# Patient Record
Sex: Male | Born: 1970 | Race: White | Hispanic: No | Marital: Married | Smoking: Current some day smoker
Health system: Southern US, Community
[De-identification: ages and names within clinical notes are randomized; demographics above are authoritative.]

## PROBLEM LIST (undated history)

## (undated) DIAGNOSIS — E079 Disorder of thyroid, unspecified: Secondary | ICD-10-CM

## (undated) DIAGNOSIS — M1990 Unspecified osteoarthritis, unspecified site: Secondary | ICD-10-CM

## (undated) DIAGNOSIS — Z8052 Family history of malignant neoplasm of bladder: Secondary | ICD-10-CM

## (undated) DIAGNOSIS — N529 Male erectile dysfunction, unspecified: Secondary | ICD-10-CM

## (undated) DIAGNOSIS — C20 Malignant neoplasm of rectum: Secondary | ICD-10-CM

## (undated) DIAGNOSIS — Z800 Family history of malignant neoplasm of digestive organs: Secondary | ICD-10-CM

## (undated) DIAGNOSIS — E785 Hyperlipidemia, unspecified: Secondary | ICD-10-CM

## (undated) DIAGNOSIS — K219 Gastro-esophageal reflux disease without esophagitis: Secondary | ICD-10-CM

## (undated) DIAGNOSIS — C801 Malignant (primary) neoplasm, unspecified: Secondary | ICD-10-CM

## (undated) HISTORY — DX: Hyperlipidemia, unspecified: E78.5

## (undated) HISTORY — DX: Malignant neoplasm of rectum: C20

## (undated) HISTORY — DX: Family history of malignant neoplasm of bladder: Z80.52

## (undated) HISTORY — PX: PARATHYROIDECTOMY: SHX19

## (undated) HISTORY — PX: APPENDECTOMY: SHX54

## (undated) HISTORY — DX: Family history of malignant neoplasm of digestive organs: Z80.0

## (undated) HISTORY — PX: COLONOSCOPY: SHX174

## (undated) HISTORY — DX: Unspecified osteoarthritis, unspecified site: M19.90

## (undated) HISTORY — DX: Male erectile dysfunction, unspecified: N52.9

---

## 2010-11-09 DIAGNOSIS — N529 Male erectile dysfunction, unspecified: Secondary | ICD-10-CM | POA: Insufficient documentation

## 2010-11-09 DIAGNOSIS — E782 Mixed hyperlipidemia: Secondary | ICD-10-CM | POA: Insufficient documentation

## 2013-07-10 DIAGNOSIS — E7800 Pure hypercholesterolemia, unspecified: Secondary | ICD-10-CM | POA: Insufficient documentation

## 2013-07-10 DIAGNOSIS — E079 Disorder of thyroid, unspecified: Secondary | ICD-10-CM | POA: Insufficient documentation

## 2014-11-05 DIAGNOSIS — F17200 Nicotine dependence, unspecified, uncomplicated: Secondary | ICD-10-CM | POA: Insufficient documentation

## 2016-11-25 DIAGNOSIS — M170 Bilateral primary osteoarthritis of knee: Secondary | ICD-10-CM | POA: Insufficient documentation

## 2016-12-21 DIAGNOSIS — C801 Malignant (primary) neoplasm, unspecified: Secondary | ICD-10-CM

## 2016-12-21 HISTORY — DX: Malignant (primary) neoplasm, unspecified: C80.1

## 2016-12-24 DIAGNOSIS — Z85048 Personal history of other malignant neoplasm of rectum, rectosigmoid junction, and anus: Secondary | ICD-10-CM | POA: Insufficient documentation

## 2016-12-25 ENCOUNTER — Ambulatory Visit: Payer: BLUE CROSS/BLUE SHIELD | Admitting: Internal Medicine

## 2016-12-25 ENCOUNTER — Other Ambulatory Visit (INDEPENDENT_AMBULATORY_CARE_PROVIDER_SITE_OTHER): Payer: BLUE CROSS/BLUE SHIELD

## 2016-12-25 ENCOUNTER — Ambulatory Visit (INDEPENDENT_AMBULATORY_CARE_PROVIDER_SITE_OTHER)
Admission: RE | Admit: 2016-12-25 | Discharge: 2016-12-25 | Disposition: A | Discharge status: Home / Self Care | Payer: BLUE CROSS/BLUE SHIELD | Source: Ambulatory Visit | Attending: Internal Medicine | Admitting: Internal Medicine

## 2016-12-25 ENCOUNTER — Other Ambulatory Visit: Payer: Self-pay

## 2016-12-25 ENCOUNTER — Encounter: Payer: Self-pay | Admitting: *Deleted

## 2016-12-25 VITALS — BP 100/72 | HR 76 | Ht 75.0 in | Wt 232.2 lb

## 2016-12-25 DIAGNOSIS — C20 Malignant neoplasm of rectum: Secondary | ICD-10-CM

## 2016-12-25 LAB — COMPREHENSIVE METABOLIC PANEL
ALBUMIN: 4.7 g/dL (ref 3.5–5.2)
ALK PHOS: 54 U/L (ref 39–117)
ALT: 20 U/L (ref 0–53)
AST: 20 U/L (ref 0–37)
BUN: 13 mg/dL (ref 6–23)
CO2: 27 mEq/L (ref 19–32)
CREATININE: 1.12 mg/dL (ref 0.40–1.50)
Calcium: 9.4 mg/dL (ref 8.4–10.5)
Chloride: 103 mEq/L (ref 96–112)
GFR: 74.09 mL/min (ref >60.00)
GLUCOSE: 93 mg/dL (ref 70–99)
Potassium: 4.2 mEq/L (ref 3.5–5.1)
SODIUM: 136 meq/L (ref 135–145)
TOTAL PROTEIN: 7.7 g/dL (ref 6.0–8.3)
Total Bilirubin: 0.9 mg/dL (ref 0.2–1.2)

## 2016-12-25 LAB — CBC WITH DIFFERENTIAL/PLATELET
Basophils Absolute: 0 10*3/uL (ref 0.0–0.1)
Basophils Relative: 0.9 % (ref 0.0–3.0)
EOS ABS: 0.3 10*3/uL (ref 0.0–0.7)
Eosinophils Relative: 5.5 % — ABNORMAL HIGH (ref 0.0–5.0)
HCT: 45.9 % (ref 39.0–52.0)
HEMOGLOBIN: 15.3 g/dL (ref 13.0–17.0)
LYMPHS ABS: 2.3 10*3/uL (ref 0.7–4.0)
Lymphocytes Relative: 42.8 % (ref 12.0–46.0)
MCHC: 33.4 g/dL (ref 30.0–36.0)
MCV: 86.5 fl (ref 78.0–100.0)
MONO ABS: 0.6 10*3/uL (ref 0.1–1.0)
Monocytes Relative: 11.6 % (ref 3.0–12.0)
NEUTROS PCT: 39.2 % — AB (ref 43.0–77.0)
Neutro Abs: 2.1 10*3/uL (ref 1.4–7.7)
Platelets: 229 10*3/uL (ref 150.0–400.0)
RBC: 5.31 Mil/uL (ref 4.22–5.81)
RDW: 13.6 % (ref 11.5–15.5)
WBC: 5.3 10*3/uL (ref 4.0–10.5)

## 2016-12-25 LAB — PROTIME-INR
INR: 1 ratio (ref 0.8–1.0)
Prothrombin Time: 11 s (ref 9.6–13.1)

## 2016-12-25 MED ORDER — IOPAMIDOL (ISOVUE-300) INJECTION 61%
100.0000 mL | Freq: Once | INTRAVENOUS | Status: AC | PRN
  Administered 2016-12-25: 100 mL via INTRAVENOUS

## 2016-12-25 NOTE — Progress Notes (Signed)
HISTORY OF PRESENT ILLNESS:  Lucas Morrison is a 46 y.o. male , [place] refugee and current Therapist, sports at [employer], who presents today at the urging of a mutual friend and his own interest with a chief complaint newly diagnosed rectal cancer. Patient reports a 2-1/2 to three-month history of intermittent rectal bleeding. No other signs or symptoms. He subsequently underwent complete colonoscopy on 12/21/2016 with Dr. Mathews Robinsons in [place]. I have reviewed the colonoscopy and pathology reports. The examination was complete to the cecum with a the BBPS score of 8. The most significant finding was a polypoid ulcerative mid-rectal mass which was non-circumferential and nonobstructing. Biopsies revealed moderately differentiated adenocarcinoma. Incidental 4 mm polyp in the rectum also noted but not removed. No other abnormalities. Patient is accompanied today by his wife and son. He denies abdominal pain or weight loss. His mother was diagnosed with colon cancer in her 23s. Patient tells me that he was to have routine colonoscopy several years ago but ended up needing to cancel that appointment. No relevant blood work or x-rays for review. He is status post appendectomy approximately 6 months ago. Except for hyperlipidemia he is otherwise healthy. He does smoke.  REVIEW OF SYSTEMS:  All non-GI ROS negative unless otherwise stated in the history of present illness   Past Medical History:  Diagnosis Date  . Erectile dysfunction   . Hyperlipidemia   . Osteoarthritis   . Rectal adenocarcinoma [facility])     Past Surgical History:  Procedure Laterality Date  . APPENDECTOMY    . PARATHYROIDECTOMY Left     Social History Lucas Morrison  reports that he has been smoking.  he has never used smokeless tobacco. He reports that he drinks alcohol. He reports that he does not use drugs.  family history includes Colon cancer in his mother; Heart disease in his father; Lung cancer in his  father.  No Known Allergies     PHYSICAL EXAMINATION: Vital signs: BP 100/72   Pulse 76   Ht 6\' 3"  (1.905 m)   Wt 232 lb 3.2 oz (105.3 kg)   BMI 29.02 kg/m   Constitutional: generally well-appearing, no acute distress Psychiatric: alert and oriented x3, cooperative Eyes: extraocular movements intact, anicteric, conjunctiva pink Mouth: oral pharynx moist, no lesions Neck: supple no lymphadenopathy Cardiovascular: heart regular rate and rhythm, no murmur Lungs: clear to auscultation bilaterally Abdomen: soft, nontender, nondistended, no obvious ascites, no peritoneal signs, normal bowel sounds, no organomegaly Rectal: Omitted Extremities: no clubbing, cyanosis, or lower extremity edema bilaterally Skin: Tattoos on the anterior and posterior torso. no relevant lesions on visible extremities Neuro: No focal deficits. Cranial nerves intact  ASSESSMENT:  #1. Mid-rectal cancer. Moderately differentiated adenocarcinoma #2. Family history of colon cancer in mother in her 81s   PLAN:  #1. CBC, comprehensive metabolic panel, PT/INR, CEA level today #2. Contrast-enhanced CT scan of the chest, abdomen, and pelvis today. Rule out metastatic disease #3. Schedule endoscopic ultrasound with tattoo marking with Dr. Ardis Hughs. Discussed with Dr. Ardis Hughs #4. Patient has been referred to the GI oncology coordinator #5. Recall colonoscopy 1 year. Entered into the computer

## 2016-12-25 NOTE — H&P (View-Only) (Signed)
HISTORY OF PRESENT ILLNESS:  Lucas Morrison is a 46 y.o. male , [place] refugee and current Therapist, sports at [employer], who presents today at the urging of a mutual friend and his own interest with a chief complaint newly diagnosed rectal cancer. Patient reports a 2-1/2 to three-month history of intermittent rectal bleeding. No other signs or symptoms. He subsequently underwent complete colonoscopy on 12/21/2016 with Dr. Mathews Robinsons in [place]. I have reviewed the colonoscopy and pathology reports. The examination was complete to the cecum with a the BBPS score of 8. The most significant finding was a polypoid ulcerative mid-rectal mass which was non-circumferential and nonobstructing. Biopsies revealed moderately differentiated adenocarcinoma. Incidental 4 mm polyp in the rectum also noted but not removed. No other abnormalities. Patient is accompanied today by his wife and son. He denies abdominal pain or weight loss. His mother was diagnosed with colon cancer in her 3s. Patient tells me that he was to have routine colonoscopy several years ago but ended up needing to cancel that appointment. No relevant blood work or x-rays for review. He is status post appendectomy approximately 6 months ago. Except for hyperlipidemia he is otherwise healthy. He does smoke.  REVIEW OF SYSTEMS:  All non-GI ROS negative unless otherwise stated in the history of present illness   Past Medical History:  Diagnosis Date  . Erectile dysfunction   . Hyperlipidemia   . Osteoarthritis   . Rectal adenocarcinoma [facility])     Past Surgical History:  Procedure Laterality Date  . APPENDECTOMY    . PARATHYROIDECTOMY Left     Social History Lucas Morrison  reports that he has been smoking.  he has never used smokeless tobacco. He reports that he drinks alcohol. He reports that he does not use drugs.  family history includes Colon cancer in his mother; Heart disease in his father; Lung cancer in his  father.  No Known Allergies     PHYSICAL EXAMINATION: Vital signs: BP 100/72   Pulse 76   Ht 6\' 3"  (1.905 m)   Wt 232 lb 3.2 oz (105.3 kg)   BMI 29.02 kg/m   Constitutional: generally well-appearing, no acute distress Psychiatric: alert and oriented x3, cooperative Eyes: extraocular movements intact, anicteric, conjunctiva pink Mouth: oral pharynx moist, no lesions Neck: supple no lymphadenopathy Cardiovascular: heart regular rate and rhythm, no murmur Lungs: clear to auscultation bilaterally Abdomen: soft, nontender, nondistended, no obvious ascites, no peritoneal signs, normal bowel sounds, no organomegaly Rectal: Omitted Extremities: no clubbing, cyanosis, or lower extremity edema bilaterally Skin: Tattoos on the anterior and posterior torso. no relevant lesions on visible extremities Neuro: No focal deficits. Cranial nerves intact  ASSESSMENT:  #1. Mid-rectal cancer. Moderately differentiated adenocarcinoma #2. Family history of colon cancer in mother in her 69s   PLAN:  #1. CBC, comprehensive metabolic panel, PT/INR, CEA level today #2. Contrast-enhanced CT scan of the chest, abdomen, and pelvis today. Rule out metastatic disease #3. Schedule endoscopic ultrasound with tattoo marking with Dr. Ardis Hughs. Discussed with Dr. Ardis Hughs #4. Patient has been referred to the GI oncology coordinator #5. Recall colonoscopy 1 year. Entered into the computer

## 2016-12-25 NOTE — Patient Instructions (Addendum)
Your physician has requested that you go to the basement for the following lab work before leaving today: CBC, CMET, PT/INR, CEA  You have been scheduled for a CT scan of the chest/abdomen and pelvis at [facility] ([address]---this is in the same building as Press [facility]).   You are scheduled on TODAY at 2:00 pm. You should arrive 15 minutes prior to your appointment time for registration. Please follow the written instructions below on the day of your exam:  WARNING: IF YOU ARE ALLERGIC TO IODINE/X-RAY DYE, PLEASE NOTIFY RADIOLOGY IMMEDIATELY AT [phone]! YOU WILL BE GIVEN A 13 HOUR PREMEDICATION PREP.  1) Do not eat or drink anything after 10:00 am (4 hours prior to your test) 2) You have been given 2 bottles of oral contrast to drink. The solution may taste               better if refrigerated, but do NOT add ice or any other liquid to this solution. Shake             well before drinking.    Drink 1 bottle of contrast @ 12:00 pm (2 hours prior to your exam)  Drink 1 bottle of contrast @ 1:00 pm (1 hour prior to your exam)  You may take any medications as prescribed with a small amount of water except for the following: Metformin, Glucophage, Glucovance, Avandamet, Riomet, Fortamet, Actoplus Met, Janumet, Glumetza or Metaglip. The above medications must be held the day of the exam AND 48 hours after the exam.  The purpose of you drinking the oral contrast is to aid in the visualization of your intestinal tract. The contrast solution may cause some diarrhea. Before your exam is started, you will be given a small amount of fluid to drink. Depending on your individual set of symptoms, you may also receive an intravenous injection of x-ray contrast/dye. Plan on being at [facility] for 30 minutes or longer, depending on the type of exam you are having performed.  This test typically takes 30-45 minutes to complete.  If you have any questions regarding your  exam or if you need to reschedule, you may call the CT department at [phone] between the hours of 8:00 am and 5:00 pm, Monday-Friday.  ________________________________________________________________________  We will contact you regarding scheduling EUS (endoscopic ultrasound) with Dr Ardis Hughs after CT scan.  You will be due for a recall colonoscopy in 12/2017. We will send you a reminder in the mail when it gets closer to that time.  If you are age 37 or older, your body mass index should be between 23-30. Your Body mass index is 29.02 kg/m. If this is out of the aforementioned range listed, please consider follow up with your Primary Care Provider.  If you are age 14 or younger, your body mass index should be between 19-25. Your Body mass index is 29.02 kg/m. If this is out of the aformentioned range listed, please consider follow up with your Primary Care Provider.   Thank you for entrusting me with your care, Dr Scarlette Shorts

## 2016-12-28 ENCOUNTER — Telehealth: Payer: Self-pay | Admitting: Oncology

## 2016-12-28 ENCOUNTER — Encounter: Payer: Self-pay | Admitting: Radiation Oncology

## 2016-12-28 DIAGNOSIS — C20 Malignant neoplasm of rectum: Secondary | ICD-10-CM

## 2016-12-28 LAB — CEA: CEA: 3.5 ng/mL — AB

## 2016-12-28 NOTE — Progress Notes (Signed)
GI Location of Tumor / Histology: Rectal Cancer  Lucas Morrison presented  months ago with symptoms of: 2-3 months rectal bleeding  Biopsies of  (if applicable) revealed: 92/42/68 =Moderately diofferentiated adenocarcinoma   Past/Anticipated interventions by surgeon, if any: Colonoscopy with biopsy 12/21/16, Dr. Mathews Robinsons in [facility],   Past/Anticipated interventions by medical oncology, if any: Dr. Benay Spice appt 01/12/2017  Weight changes, if any: No  Bowel/Bladder complaints, if any no  Nausea / Vomiting, if any: No  Pain issues, if any: NO  Any blood per rectum:   2 days ago spotting slight blood  SAFETY ISSUES: NO  Prior radiation? No  Pacemaker/ICD? NO  Is the patient on methotrexate? NO  Current Complaints/Details: Married, [place] refugee , s/p appendectomy  6 months ago,parathyroidectomy ,left,, smokes cigarettes, no smokeless tobacco, drinks alcohol,no drug use Mother dx colon cancer in her 31's,Father heart disease,lung cancer  Allergies:NKA BP 120/79   Pulse 67   Temp 97.6 F (36.4 C) (Oral)   Resp 20   Ht 6\' 3"  (1.905 m)   Wt 227 lb 9.6 oz (103.2 kg)   BMI 28.45 kg/m

## 2016-12-28 NOTE — Telephone Encounter (Signed)
Left message on voicemail regarding appointment date/time/location/phone #

## 2016-12-30 ENCOUNTER — Encounter: Payer: Self-pay | Admitting: Radiation Oncology

## 2016-12-30 ENCOUNTER — Other Ambulatory Visit: Payer: Self-pay

## 2016-12-30 DIAGNOSIS — C20 Malignant neoplasm of rectum: Secondary | ICD-10-CM

## 2016-12-30 NOTE — Progress Notes (Signed)
Radiation Oncology         [phone] ________________________________  Name: Lucas Morrison        MRN: [id number]  Date of Service: 12/31/2016 DOB: 1970/11/17  NW:GNFAOZ, Lucas Murdoch, PA-C  Ladell Pier, MD     REFERRING PHYSICIAN: Ladell Pier, MD   DIAGNOSIS: The encounter diagnosis was Rectal cancer [facility]).   HISTORY OF PRESENT ILLNESS: Lucas Morrison is a 46 y.o. male seen at the request of Dr. Henrene Pastor for a new diagnosis of adenocarcinoma of the rectum.  The patient developed rectal bleeding 2-3 months ago, and was seen for evaluation in [place].  He underwent colonoscopy on 12/21/2016.  On exam he didn't have any palpable findings. He had a polypoid ulcerative mid rectal mass which was not circumferential and nonobstructing, and a biopsy revealed moderately differentiated adenocarcinoma he also had a 4 mm incidental in the rectum which was noted but not removed.  He met with Dr. Henrene Pastor on 12/25/2016, and CT scan of the chest abdomen and pelvis revealed no evidence of metastatic disease or regional adenopathy.  He has been scheduled to undergo endoscopic ultrasound this afternoon with Dr. Ardis Hughs, and comes today to discuss the role of radiotherapy.  He is scheduled to see Dr. Benay Spice on 01/13/17.    PREVIOUS RADIATION THERAPY: No   PAST MEDICAL HISTORY:  Past Medical History:  Diagnosis Date  . Cancer ([place]) 12/21/2016   Rectal  . Erectile dysfunction   . Hyperlipidemia   . Osteoarthritis   . Rectal adenocarcinoma ([place])   . Thyroid disease    left parathyroidectomy       PAST SURGICAL HISTORY: Past Surgical History:  Procedure Laterality Date  . APPENDECTOMY    . PARATHYROIDECTOMY Left      FAMILY HISTORY:  Family History  Problem Relation Age of Onset  . Colon cancer Mother        39's at dx  . Lung cancer Father   . Heart disease Father      SOCIAL HISTORY:  reports that he has been smoking.  he has never used smokeless tobacco. He reports that he  drinks alcohol. He reports that he does not use drugs. The patient is married and lives in [place]. He is originally from [place], and has lived in the [place] since the 1990s. He works as a Associate Professor for an Journalist, newspaper. His is accompanied by his wife and has two sons.   ALLERGIES: Patient has no known allergies.   MEDICATIONS:  Current Outpatient Medications  Medication Sig Dispense Refill  . ezetimibe (ZETIA) 10 MG tablet Take 10 mg by mouth daily.    . fenofibrate 160 MG tablet Take 160 mg by mouth daily with lunch.     . Multiple Vitamin (MULTIVITAMIN WITH MINERALS) TABS tablet Take 1 tablet by mouth daily.    . naproxen sodium (ALEVE) 220 MG tablet Take 220-440 mg by mouth daily as needed (pain).     . Omega-3 Fatty Acids (FISH OIL) 1000 MG CAPS Take 1,000 mg by mouth daily.    . rosuvastatin (CRESTOR) 40 MG tablet Take 40 mg by mouth daily with supper.     . sildenafil (VIAGRA) 100 MG tablet Take 100 mg by mouth as needed for erectile dysfunction.    Marland Kitchen guaiFENesin (MUCINEX) 600 MG 12 hr tablet Take 600 mg by mouth daily as needed for cough.     No current facility-administered medications for this encounter.      REVIEW  OF SYSTEMS: On review of systems, the patient reports that he is doing well overall. He denies any chest pain, shortness of breath, cough, fevers, chills, night sweats, unintended weight changes. He continues to have bright red blood per rectum with bowel movements. He denies any bladder disturbances, and denies abdominal pain, nausea or vomiting. He denies any new musculoskeletal or joint aches or pains. A complete review of systems is obtained and is otherwise negative.    PHYSICAL EXAM:  Wt Readings from Last 3 Encounters:  12/31/16 227 lb 9.6 oz (103.2 kg)  12/31/16 227 lb 9.6 oz (103.2 kg)  12/25/16 232 lb 3.2 oz (105.3 kg)   Temp Readings from Last 3 Encounters:  12/31/16 97.6 F (36.4 C) (Oral)  12/31/16 97.6 F  (36.4 C) (Oral)   BP Readings from Last 3 Encounters:  12/31/16 120/79  12/31/16 120/79  12/25/16 100/72   Pulse Readings from Last 3 Encounters:  12/31/16 67  12/31/16 67  12/25/16 76   Pain Assessment Pain Score: 0-No pain/10   In general this is a well appearing [place] European male in no acute distress. He is alert and oriented x4 and appropriate throughout the examination. HEENT reveals that the patient is normocephalic, atraumatic. EOMs are intact. PERRLA. Skin is intact without any evidence of gross lesions. Cardiovascular exam reveals a regular rate and rhythm, no clicks rubs or murmurs are auscultated. Chest is clear to auscultation bilaterally. Lymphatic assessment is performed and does not reveal any adenopathy in the cervical, supraclavicular, axillary, or inguinal chains. Abdomen has active bowel sounds in all quadrants and is intact. The abdomen is soft, non tender, non distended. Lower extremities are negative for pretibial pitting edema, deep calf tenderness, cyanosis or clubbing. Rectal exam is deferred.   ECOG = 1  0 - Asymptomatic (Fully active, able to carry on all predisease activities without restriction)  1 - Symptomatic but completely ambulatory (Restricted in physically strenuous activity but ambulatory and able to carry out work of a light or sedentary nature. For example, light housework, office work)  2 - Symptomatic, <50% in bed during the day (Ambulatory and capable of all self care but unable to carry out any work activities. Up and about more than 50% of waking hours)  3 - Symptomatic, >50% in bed, but not bedbound (Capable of only limited self-care, confined to bed or chair 50% or more of waking hours)  4 - Bedbound (Completely disabled. Cannot carry on any self-care. Totally confined to bed or chair)  5 - Death   Eustace Pen MM, Creech RH, Tormey DC, et al. [phone]). "Toxicity and response criteria of the [facility]". [place]  Oncol. 5 (6): 649-55    LABORATORY DATA:  Lab Results  Component Value Date   WBC 5.3 12/25/2016   HGB 15.3 12/25/2016   HCT 45.9 12/25/2016   MCV 86.5 12/25/2016   PLT 229.0 12/25/2016   Lab Results  Component Value Date   NA 136 12/25/2016   K 4.2 12/25/2016   CL 103 12/25/2016   CO2 27 12/25/2016   Lab Results  Component Value Date   ALT 20 12/25/2016   AST 20 12/25/2016   ALKPHOS 54 12/25/2016   BILITOT 0.9 12/25/2016      RADIOGRAPHY: Ct Chest W Contrast  Result Date: 12/26/2016 CLINICAL DATA:  New diagnosis rectal cancer. Evaluate for metastatic disease. EXAM: CT CHEST, ABDOMEN, AND PELVIS WITH CONTRAST TECHNIQUE: Multidetector CT imaging of the chest, abdomen and pelvis was  performed following the standard protocol during bolus administration of intravenous contrast. CONTRAST:  124m ISOVUE-300 IOPAMIDOL (ISOVUE-300) INJECTION 61% COMPARISON:  None. FINDINGS: CT CHEST FINDINGS Cardiovascular: Heart is normal size. Aorta is normal caliber. Mediastinum/Nodes: No mediastinal, hilar, or axillary adenopathy. Trachea and esophagus are unremarkable. Thyroid unremarkable. Lungs/Pleura: Lungs are clear. No focal airspace opacities or suspicious nodules. No effusions. Musculoskeletal: No acute bony abnormality or focal bone lesion. CT ABDOMEN PELVIS FINDINGS Hepatobiliary: No focal hepatic abnormality. Gallbladder unremarkable. Pancreas: No focal abnormality or ductal dilatation. Spleen: No focal abnormality.  Normal size. Adrenals/Urinary Tract: No adrenal abnormality. No focal renal abnormality. No stones or hydronephrosis. Urinary bladder is unremarkable. Stomach/Bowel: Patient's known rectal cancer is not evident by CT. Stomach, large and small bowel grossly unremarkable. Vascular/Lymphatic: Aortic and iliac calcifications. No evidence of aneurysm or adenopathy. No visible perirectal lymph nodes. Reproductive: No visible focal abnormality. Other: No free fluid or free air.  Musculoskeletal: Degenerative changes in the lumbar spine. IMPRESSION: No evidence of metastatic disease in the chest, abdomen or pelvis. Electronically Signed   By: KRolm BaptiseM.D.   On: 12/26/2016 14:58   Ct Abdomen Pelvis W Contrast  Result Date: 12/26/2016 CLINICAL DATA:  New diagnosis rectal cancer. Evaluate for metastatic disease. EXAM: CT CHEST, ABDOMEN, AND PELVIS WITH CONTRAST TECHNIQUE: Multidetector CT imaging of the chest, abdomen and pelvis was performed following the standard protocol during bolus administration of intravenous contrast. CONTRAST:  1034mISOVUE-300 IOPAMIDOL (ISOVUE-300) INJECTION 61% COMPARISON:  None. FINDINGS: CT CHEST FINDINGS Cardiovascular: Heart is normal size. Aorta is normal caliber. Mediastinum/Nodes: No mediastinal, hilar, or axillary adenopathy. Trachea and esophagus are unremarkable. Thyroid unremarkable. Lungs/Pleura: Lungs are clear. No focal airspace opacities or suspicious nodules. No effusions. Musculoskeletal: No acute bony abnormality or focal bone lesion. CT ABDOMEN PELVIS FINDINGS Hepatobiliary: No focal hepatic abnormality. Gallbladder unremarkable. Pancreas: No focal abnormality or ductal dilatation. Spleen: No focal abnormality.  Normal size. Adrenals/Urinary Tract: No adrenal abnormality. No focal renal abnormality. No stones or hydronephrosis. Urinary bladder is unremarkable. Stomach/Bowel: Patient's known rectal cancer is not evident by CT. Stomach, large and small bowel grossly unremarkable. Vascular/Lymphatic: Aortic and iliac calcifications. No evidence of aneurysm or adenopathy. No visible perirectal lymph nodes. Reproductive: No visible focal abnormality. Other: No free fluid or free air. Musculoskeletal: Degenerative changes in the lumbar spine. IMPRESSION: No evidence of metastatic disease in the chest, abdomen or pelvis. Electronically Signed   By: KeRolm Baptise.D.   On: 12/26/2016 14:58       IMPRESSION/PLAN: 1. Adenocarcinoma of the  rectum. Dr. MoLisbeth Renshawiscusses the pathology findings and reviews the nature of rectal cancer. We will await his formal EUS result today, but based on his colonoscopy description, would anticipate a course of neoadjuvant radiotherapy with chemosensitization. He is scheduled for evaluation with Dr. ThMarcello Mooresn CRGrand Riverext Thursday, and to see Dr. ShBenay Spicen 01/13/17. Dr. MoLisbeth Renshawutlines the goals of radiation and recommends proceeding. We discussed the risks, benefits, short, and long term effects of radiotherapy, and the patient is interested in proceeding. Dr. MoLisbeth Renshawiscusses the delivery and logistics of radiotherapy and anticipates a course of 5 1/2-6 weeks. Written consent is obtained and placed in the chart, a copy was provided to the patient. He will simulate this morning with anticipation of starting treatment on 01/18/17. 2. Possible genetic predisposition to malignancy. The patient is counseled on his family history and personal history and that there may be a genetic link to [facility]. We discussed referral to genetic counseling  and he is in agreement with this plan.  The above documentation reflects my direct findings during this shared patient visit. Please see the separate note by Dr. Lisbeth Renshaw on this date for the remainder of the patient's plan of care.    Carola Rhine, PAC

## 2016-12-31 ENCOUNTER — Ambulatory Visit (HOSPITAL_COMMUNITY)
Admission: RE | Admit: 2016-12-31 | Discharge: 2016-12-31 | Disposition: A | Discharge status: Home / Self Care | Payer: BLUE CROSS/BLUE SHIELD | Source: Ambulatory Visit | Attending: Gastroenterology | Admitting: Gastroenterology

## 2016-12-31 ENCOUNTER — Other Ambulatory Visit: Payer: Self-pay

## 2016-12-31 ENCOUNTER — Encounter: Payer: Self-pay | Admitting: Radiation Oncology

## 2016-12-31 ENCOUNTER — Encounter (HOSPITAL_COMMUNITY): Payer: Self-pay | Admitting: Gastroenterology

## 2016-12-31 ENCOUNTER — Encounter (HOSPITAL_COMMUNITY)
Admission: RE | Disposition: A | Discharge status: Home / Self Care | Payer: Self-pay | Source: Ambulatory Visit | Attending: Gastroenterology

## 2016-12-31 ENCOUNTER — Ambulatory Visit
Admission: RE | Admit: 2016-12-31 | Discharge: 2016-12-31 | Disposition: A | Discharge status: Home / Self Care | Payer: BLUE CROSS/BLUE SHIELD | Source: Ambulatory Visit | Attending: Radiation Oncology | Admitting: Radiation Oncology

## 2016-12-31 VITALS — BP 120/79 | HR 67 | Temp 97.6°F | Resp 20 | Ht 75.0 in | Wt 227.6 lb

## 2016-12-31 DIAGNOSIS — M1990 Unspecified osteoarthritis, unspecified site: Secondary | ICD-10-CM | POA: Insufficient documentation

## 2016-12-31 DIAGNOSIS — Z510 Encounter for antineoplastic radiation therapy: Secondary | ICD-10-CM | POA: Insufficient documentation

## 2016-12-31 DIAGNOSIS — K6289 Other specified diseases of anus and rectum: Secondary | ICD-10-CM

## 2016-12-31 DIAGNOSIS — F17200 Nicotine dependence, unspecified, uncomplicated: Secondary | ICD-10-CM | POA: Diagnosis not present

## 2016-12-31 DIAGNOSIS — C20 Malignant neoplasm of rectum: Secondary | ICD-10-CM | POA: Diagnosis present

## 2016-12-31 DIAGNOSIS — E785 Hyperlipidemia, unspecified: Secondary | ICD-10-CM | POA: Insufficient documentation

## 2016-12-31 DIAGNOSIS — C218 Malignant neoplasm of overlapping sites of rectum, anus and anal canal: Secondary | ICD-10-CM | POA: Diagnosis not present

## 2016-12-31 DIAGNOSIS — K621 Rectal polyp: Secondary | ICD-10-CM | POA: Diagnosis not present

## 2016-12-31 DIAGNOSIS — E079 Disorder of thyroid, unspecified: Secondary | ICD-10-CM | POA: Insufficient documentation

## 2016-12-31 DIAGNOSIS — Z79899 Other long term (current) drug therapy: Secondary | ICD-10-CM | POA: Diagnosis not present

## 2016-12-31 DIAGNOSIS — Z800 Family history of malignant neoplasm of digestive organs: Secondary | ICD-10-CM | POA: Diagnosis not present

## 2016-12-31 HISTORY — PX: EUS: SHX5427

## 2016-12-31 HISTORY — DX: Malignant (primary) neoplasm, unspecified: C80.1

## 2016-12-31 HISTORY — DX: Disorder of thyroid, unspecified: E07.9

## 2016-12-31 SURGERY — ULTRASOUND, LOWER GI TRACT, ENDOSCOPIC
Anesthesia: Moderate Sedation

## 2016-12-31 MED ORDER — MIDAZOLAM HCL 5 MG/ML IJ SOLN
INTRAMUSCULAR | Status: AC
  Filled 2016-12-31: qty 2

## 2016-12-31 MED ORDER — DIPHENHYDRAMINE HCL 50 MG/ML IJ SOLN
INTRAMUSCULAR | Status: AC
  Filled 2016-12-31: qty 1

## 2016-12-31 MED ORDER — MIDAZOLAM HCL 10 MG/2ML IJ SOLN
INTRAMUSCULAR | Status: DC | PRN
  Administered 2016-12-31 (×3): 2 mg via INTRAVENOUS

## 2016-12-31 MED ORDER — SPOT INK MARKER SYRINGE KIT
PACK | SUBMUCOSAL | Status: AC
  Filled 2016-12-31: qty 5

## 2016-12-31 MED ORDER — SODIUM CHLORIDE 0.9 % IV SOLN
INTRAVENOUS | Status: DC
  Administered 2016-12-31: 14:00:00 via INTRAVENOUS

## 2016-12-31 MED ORDER — FENTANYL CITRATE (PF) 100 MCG/2ML IJ SOLN
INTRAMUSCULAR | Status: AC
  Filled 2016-12-31: qty 2

## 2016-12-31 MED ORDER — SPOT INK MARKER SYRINGE KIT
PACK | SUBMUCOSAL | Status: DC | PRN
  Administered 2016-12-31: 4 mL via SUBMUCOSAL

## 2016-12-31 MED ORDER — DIPHENHYDRAMINE HCL 50 MG/ML IJ SOLN
INTRAMUSCULAR | Status: DC | PRN
  Administered 2016-12-31 (×3): 25 mg via INTRAVENOUS

## 2016-12-31 NOTE — Progress Notes (Signed)
Please see the Nurse Progress Note in the MD Initial Consult Encounter for this patient. 

## 2016-12-31 NOTE — Op Note (Signed)
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 Estimated blood loss:                            None. Estimated Blood Loss:     Estimated blood loss: none. Procedure:                Pre-Anesthesia Assessment:                           - Prior to the procedure, a History and Physical                            was performed, and patient medications and                            allergies were reviewed. The patient's tolerance of                            previous anesthesia was also reviewed. The risks                            and benefits of the procedure and the sedation                            options and risks were discussed with the patient.                            All questions were answered, and informed consent                            was obtained. Prior Anticoagulants: The patient has  taken no previous anticoagulant or antiplatelet                            agents. ASA Grade Assessment: II - A patient with                            mild systemic disease. After reviewing the risks                            and benefits, the patient was deemed in                            satisfactory condition to undergo the procedure.                           After obtaining  informed consent, the endoscope was                            passed under direct vision. Throughout the                            procedure, the patient's blood pressure, pulse, and                            oxygen saturations were monitored continuously. The                            [id number] ([id number]) scope was introduced through                            the anus and advanced to the the sigmoid colon. The                            lower EUS was accomplished without difficulty. The                            patient tolerated the procedure well. The quality                            of the bowel preparation was good. Scope In: Scope Out: Findings:      Sigmoidoscopic findings:      1. Round, non-circumferential clearly malignant mass along the wall of       the posterior rectum. The mass is about 3cm across and the distal edge       lays 6cm from the anal verge.      2. Following EUS evalution, I labeled the mass with 3 submucosal       injections of [place] Ink directly adjacent to the mass.      Endosonographic Finding :      1. The mass above correlates with a 3.5cm hypoechoic lesion that is 87mm       thick, passes focally into the muscularis propria layer but not through       it (uT2).      2. There is no perirectal adenopathy (uN0).  Impression:               - 3.5cm, round, non-circumferential uT2N0                            adenocarcinoma which lays on the posterior wall of                            the rectum with distal edge 6cm from the anal verge.                           - The mass was labeled with submucosal injections                            of [place] Ink. Moderate Sedation:      Moderate (conscious) sedation was administered by the endoscopy nurse       and supervised by the endoscopist. The following parameters were       monitored: oxygen saturation, heart rate, blood pressure, and response       to care. Total physician intraservice time was 20  minutes. Recommendation:           - Discharge patient to home (ambulatory).                           - I will communicate these results with his                            oncologic care team. Procedure Code(s):        --- Professional ---                           [phone], Sigmoidoscopy, flexible; with endoscopic                            ultrasound examination                           45335, Sigmoidoscopy, flexible; with directed                            submucosal injection(s), any substance                           99152, Moderate sedation services provided by the                            same physician or other qualified health care                            professional performing the diagnostic or                            therapeutic service that the sedation supports,                            requiring the presence of an independent trained  observer to assist in the monitoring of the                            patient's level of consciousness and physiological                            status; initial 15 minutes of intraservice time,                            patient age 71 years or older Diagnosis Code(s):        --- Professional ---                           K62.89, Other specified diseases of anus and rectum                           C21.8, Malignant neoplasm of overlapping sites of                            rectum, anus and anal canal CPT copyright 2016 [employer]. All rights reserved. The codes documented in this report are preliminary and upon coder review may  be revised to meet current compliance requirements. [name], MD 12/31/2016 3:11:40 PM This report has been signed electronically. Number of Addenda: 0

## 2016-12-31 NOTE — Progress Notes (Signed)
GI Location of Tumor / Histology: Rectal Cancer  Jerrell Kelliher presented  months ago with symptoms of: 2-3 months rectal bleeding  Biopsies of  (if applicable) revealed: 10/25/09 =Moderately diofferentiated adenocarcinoma   Past/Anticipated interventions by surgeon, if any: Colonoscopy with biopsy 12/21/16, Dr. Mathews Robinsons in [place], , today Dr. Ardis Hughs for EUS  Past/Anticipated interventions by medical oncology, if any: Dr. Benay Spice appt 01/12/2017  Weight changes, if any: No  Bowel/Bladder complaints, if any: regular bowels and bladder,  Nausea / Vomiting, if any: No  Pain issues, if any: No  Any blood per rectum:   2 days ago slight   SAFETY ISSUES:NO  Prior radiation? No  Pacemaker/ICD? No  Is the patient on methotrexate? NO  Current Complaints/Details: Married, [place] refugee , s/p appendectomy  6 months ago,parathyroidectomy ,left,, smokes cigarettes, no smokeless tobacco, drinks alcohol,no drug use Mother dx colon cancer in her 34's,Father heart disease,lung cancer  Allergies:NKA BP 120/79   Pulse 67   Temp 97.6 F (36.4 C) (Oral)   Resp 20   Ht 6\' 3"  (1.905 m)   Wt 227 lb 9.6 oz (103.2 kg)   BMI 28.45 kg/m   Wt Readings from Last 3 Encounters:  12/31/16 227 lb 9.6 oz (103.2 kg)  12/25/16 232 lb 3.2 oz (105.3 kg)

## 2016-12-31 NOTE — Discharge Instructions (Signed)
YOU HAD AN ENDOSCOPIC PROCEDURE TODAY: Refer to the procedure report and other information in the discharge instructions given to you for any specific questions about what was found during the examination. If this information does not answer your questions, please call [place] office at [phone] to clarify.  ° °YOU SHOULD EXPECT: Some feelings of bloating in the abdomen. Passage of more gas than usual. Walking can help get rid of the air that was put into your GI tract during the procedure and reduce the bloating. If you had a lower endoscopy (such as a colonoscopy or flexible sigmoidoscopy) you may notice spotting of blood in your stool or on the toilet paper. Some abdominal soreness may be present for a day or two, also. ° °DIET: Your first meal following the procedure should be a light meal and then it is ok to progress to your normal diet. A half-sandwich or bowl of soup is an example of a good first meal. Heavy or fried foods are harder to digest and may make you feel nauseous or bloated. Drink plenty of fluids but you should avoid alcoholic beverages for 24 hours. If you had a esophageal dilation, please see attached instructions for diet.   ° °ACTIVITY: Your care partner should take you home directly after the procedure. You should plan to take it easy, moving slowly for the rest of the day. You can resume normal activity the day after the procedure however YOU SHOULD NOT DRIVE, use power tools, machinery or perform tasks that involve climbing or major physical exertion for 24 hours (because of the sedation medicines used during the test).  ° °SYMPTOMS TO REPORT IMMEDIATELY: °A gastroenterologist can be reached at any hour. Please call [phone]  for any of the following symptoms:  °Following lower endoscopy (colonoscopy, flexible sigmoidoscopy) °Excessive amounts of blood in the stool  °Significant tenderness, worsening of abdominal pains  °Swelling of the abdomen that is new, acute  °Fever of 100° or  higher  °Following upper endoscopy (EGD, EUS, ERCP, esophageal dilation) °Vomiting of blood or coffee ground material  °New, significant abdominal pain  °New, significant chest pain or pain under the shoulder blades  °Painful or persistently difficult swallowing  °New shortness of breath  °Black, tarry-looking or red, bloody stools ° °FOLLOW UP:  °If any biopsies were taken you will be contacted by phone or by letter within the next 1-3 weeks. Call [phone]  if you have not heard about the biopsies in 3 weeks.  °Please also call with any specific questions about appointments or follow up tests. ° °

## 2016-12-31 NOTE — Interval H&P Note (Signed)
History and Physical Interval Note:  12/31/2016 1:46 PM  Lucas Morrison  has presented today for surgery, with the diagnosis of rectal cancer staging  The various methods of treatment have been discussed with the patient and family. After consideration of risks, benefits and other options for treatment, the patient has consented to  Procedure(s): LOWER ENDOSCOPIC ULTRASOUND (EUS) (N/A) as a surgical intervention .  The patient's history has been reviewed, patient examined, no change in status, stable for surgery.  I have reviewed the patient's chart and labs.  Questions were answered to the patient's satisfaction.     [name]

## 2017-01-01 ENCOUNTER — Encounter (HOSPITAL_COMMUNITY): Payer: Self-pay | Admitting: Gastroenterology

## 2017-01-06 ENCOUNTER — Telehealth: Payer: Self-pay

## 2017-01-06 NOTE — Telephone Encounter (Signed)
Called patient to let him know that his apt. With Dr. Benay Spice had been canceled. Patient will have surgery.

## 2017-01-07 ENCOUNTER — Ambulatory Visit: Payer: Self-pay | Admitting: General Surgery

## 2017-01-07 DIAGNOSIS — F17200 Nicotine dependence, unspecified, uncomplicated: Secondary | ICD-10-CM

## 2017-01-07 NOTE — H&P (Signed)
History of Present Illness Lucas Ruff MD; 5/0/0370 2:57 PM) The patient is a 47 year old male who presents with colorectal cancer. 47 year old male who presents to the office for evaluation of a newly diagnosed rectal cancer. He does have a family history of colon and rectal cancer. Approximately 3-4 months ago he noticed some rectal bleeding. He mentioned this to his primary care physician who sent him for colonoscopy. This showed a mid rectal mass. Biopsies confirmed adenocarcinoma. CT scans of the chest abdomen and pelvis were negative for any signs of metastatic disease. CEA was mildly elevated at 3.5. Ultrasound showed a T2 N0 lesion, which was tattooed. He denies any weight loss. He denies any abdominal pain or change in bowel habits. He underwent a laparoscopic appendectomy approximately 6 months ago. Other than that, he has no surgical history.   Past Surgical History (April Staton, [place]; 01/07/2017 2:15 PM) Thyroid Surgery  Diagnostic Studies History (April Staton, [place]; 01/07/2017 2:15 PM) Colonoscopy within last year  Allergies (April Staton, CMA; 01/07/2017 2:16 PM) No Known Drug Allergies [01/07/2017]:  Medication History (April Staton, CMA; 01/07/2017 2:17 PM) Rosuvastatin Calcium (40MG  Tablet, Oral) Active. Multiple Vitamin (Oral) Active. Fish Oil (1000MG  Capsule, Oral) Active. Medications Reconciled  Social History (April Staton, CMA; 01/07/2017 2:15 PM) Alcohol use Occasional alcohol use. Caffeine use Coffee, Tea. Tobacco use Current every day smoker.  Family History (April Staton, [place]; 01/07/2017 2:15 PM) Colon Cancer Mother. Heart Disease Father. Heart disease in male family member before age 26 Respiratory Condition Father.  Other Problems (April Staton, CMA; 01/07/2017 2:15 PM) Gastroesophageal Reflux Disease Hypercholesterolemia Thyroid Disease     Review of Systems (April Staton CMA; 01/07/2017 2:15 PM) General Not Present- Appetite Loss,  Chills, Fatigue, Fever, Night Sweats, Weight Gain and Weight Loss. Skin Not Present- Change in Wart/Mole, Dryness, Hives, Jaundice, New Lesions, Non-Healing Wounds, Rash and Ulcer. HEENT Not Present- Earache, Hearing Loss, Hoarseness, Nose Bleed, Oral Ulcers, Ringing in the Ears, Seasonal Allergies, Sinus Pain, Sore Throat, Visual Disturbances, Wears glasses/contact lenses and Yellow Eyes. Respiratory Present- Snoring. Not Present- Bloody sputum, Chronic Cough, Difficulty Breathing and Wheezing. Breast Not Present- Breast Mass, Breast Pain, Nipple Discharge and Skin Changes. Cardiovascular Not Present- Chest Pain, Difficulty Breathing Lying Down, Leg Cramps, Palpitations, Rapid Heart Rate, Shortness of Breath and Swelling of Extremities. Gastrointestinal Present- Bloody Stool. Not Present- Abdominal Pain, Bloating, Change in Bowel Habits, Chronic diarrhea, Constipation, Difficulty Swallowing, Excessive gas, Gets full quickly at meals, Hemorrhoids, Indigestion, Nausea, Rectal Pain and Vomiting. Male Genitourinary Not Present- Blood in Urine, Change in Urinary Stream, Frequency, Impotence, Nocturia, Painful Urination, Urgency and Urine Leakage. Musculoskeletal Not Present- Back Pain, Joint Pain, Joint Stiffness, Muscle Pain, Muscle Weakness and Swelling of Extremities. Neurological Not Present- Decreased Memory, Fainting, Headaches, Numbness, Seizures, Tingling, Tremor, Trouble walking and Weakness. Psychiatric Not Present- Anxiety, Bipolar, Change in Sleep Pattern, Depression, Fearful and Frequent crying. Endocrine Not Present- Cold Intolerance, Excessive Hunger, Hair Changes, Heat Intolerance, Hot flashes and New Diabetes. Hematology Not Present- Blood Thinners, Easy Bruising, Excessive bleeding, Gland problems, HIV and Persistent Infections.  Vitals (April Staton CMA; 01/07/2017 2:17 PM) 01/07/2017 2:17 PM Weight: 240 lb Height: 74in Body Surface Area: 2.35 m Body Mass Index: 30.81 kg/m   Pulse: 75 (Regular)  P.OX: 96% (Room air) BP: 122/88 (Sitting, Left Arm, Standard)      Physical Exam Lucas Ruff MD; 04/12/8889 2:50 PM)  General Mental Status-Alert. General Appearance-Not in acute distress. Build & Nutrition-Well nourished. Posture-Normal posture. Gait-Normal.  Head and  Neck Head-normocephalic, atraumatic with no lesions or palpable masses. Trachea-midline.  Chest and Lung Exam Chest and lung exam reveals -on auscultation, normal breath sounds, no adventitious sounds and normal vocal resonance.  Cardiovascular Cardiovascular examination reveals -normal heart sounds, regular rate and rhythm with no murmurs and femoral artery auscultation bilaterally reveals normal pulses, no bruits, no thrills.  Abdomen Inspection Inspection of the abdomen reveals - No Hernias. Palpation/Percussion Palpation and Percussion of the abdomen reveal - Soft, Non Tender, No Rigidity (guarding), No hepatosplenomegaly and No Palpable abdominal masses.  Rectal Anorectal Exam Internal - Note: no mass palpated.  Neurologic Neurologic evaluation reveals -alert and oriented x 3 with no impairment of recent or remote memory, normal attention span and ability to concentrate, normal sensation and normal coordination.  Musculoskeletal Normal Exam - Bilateral-Upper Extremity Strength Normal and Lower Extremity Strength Normal.    Assessment & Plan Lucas Ruff MD; 04/06/6832 2:52 PM)  RECTAL CANCER (C20) Impression: 47 year old male with recent diagnosis of rectal cancer. This is approximate 6 cm from the anal verge. Ultrasound staging is T2 N0. CT scans are neg for metastatic disease. CEA was mildly elevated. The lesion is tatooed. Since he is an early stage rectal cancer, I recommended proceeding with low anterior resection. We discussed the need for possible diverting ileostomy as well. If he has not stopped smoking by then, we will be more likely to need  this. The surgery and anatomy were described to the patient as well as the risks of surgery and the possible complications. These include: Bleeding, deep abdominal infections and possible wound complications such as hernia and infection, damage to adjacent structures, leak of surgical connections, which can lead to other surgeries and possibly an ostomy, possible need for other procedures, such as abscess drains in radiology, possible prolonged hospital stay, possible diarrhea from removal of part of the colon, possible constipation from narcotics, possible bowel, bladder or sexual dysfunction if having rectal surgery, prolonged fatigue/weakness or appetite loss, possible early recurrence of of disease, possible complications of their medical problems such as heart disease or arrhythmias or lung problems, death (less than 1%). I believe the patient understands and wishes to proceed with the surgery.

## 2017-01-07 NOTE — H&P (View-Only) (Signed)
History of Present Illness Leighton Ruff MD; 0/08/6576 2:57 PM) The patient is a 47 year old male who presents with colorectal cancer. 47 year old male who presents to the office for evaluation of a newly diagnosed rectal cancer. He does have a family history of colon and rectal cancer. Approximately 3-4 months ago he noticed some rectal bleeding. He mentioned this to his primary care physician who sent him for colonoscopy. This showed a mid rectal mass. Biopsies confirmed adenocarcinoma. CT scans of the chest abdomen and pelvis were negative for any signs of metastatic disease. CEA was mildly elevated at 3.5. Ultrasound showed a T2 N0 lesion, which was tattooed. He denies any weight loss. He denies any abdominal pain or change in bowel habits. He underwent a laparoscopic appendectomy approximately 6 months ago. Other than that, he has no surgical history.   Past Surgical History (April Staton, [place]; 01/07/2017 2:15 PM) Thyroid Surgery  Diagnostic Studies History (April Staton, [place]; 01/07/2017 2:15 PM) Colonoscopy within last year  Allergies (April Staton, CMA; 01/07/2017 2:16 PM) No Known Drug Allergies [01/07/2017]:  Medication History (April Staton, CMA; 01/07/2017 2:17 PM) Rosuvastatin Calcium (40MG  Tablet, Oral) Active. Multiple Vitamin (Oral) Active. Fish Oil (1000MG  Capsule, Oral) Active. Medications Reconciled  Social History (April Staton, CMA; 01/07/2017 2:15 PM) Alcohol use Occasional alcohol use. Caffeine use Coffee, Tea. Tobacco use Current every day smoker.  Family History (April Staton, [place]; 01/07/2017 2:15 PM) Colon Cancer Mother. Heart Disease Father. Heart disease in male family member before age 59 Respiratory Condition Father.  Other Problems (April Staton, CMA; 01/07/2017 2:15 PM) Gastroesophageal Reflux Disease Hypercholesterolemia Thyroid Disease     Review of Systems (April Staton CMA; 01/07/2017 2:15 PM) General Not Present- Appetite Loss,  Chills, Fatigue, Fever, Night Sweats, Weight Gain and Weight Loss. Skin Not Present- Change in Wart/Mole, Dryness, Hives, Jaundice, New Lesions, Non-Healing Wounds, Rash and Ulcer. HEENT Not Present- Earache, Hearing Loss, Hoarseness, Nose Bleed, Oral Ulcers, Ringing in the Ears, Seasonal Allergies, Sinus Pain, Sore Throat, Visual Disturbances, Wears glasses/contact lenses and Yellow Eyes. Respiratory Present- Snoring. Not Present- Bloody sputum, Chronic Cough, Difficulty Breathing and Wheezing. Breast Not Present- Breast Mass, Breast Pain, Nipple Discharge and Skin Changes. Cardiovascular Not Present- Chest Pain, Difficulty Breathing Lying Down, Leg Cramps, Palpitations, Rapid Heart Rate, Shortness of Breath and Swelling of Extremities. Gastrointestinal Present- Bloody Stool. Not Present- Abdominal Pain, Bloating, Change in Bowel Habits, Chronic diarrhea, Constipation, Difficulty Swallowing, Excessive gas, Gets full quickly at meals, Hemorrhoids, Indigestion, Nausea, Rectal Pain and Vomiting. Male Genitourinary Not Present- Blood in Urine, Change in Urinary Stream, Frequency, Impotence, Nocturia, Painful Urination, Urgency and Urine Leakage. Musculoskeletal Not Present- Back Pain, Joint Pain, Joint Stiffness, Muscle Pain, Muscle Weakness and Swelling of Extremities. Neurological Not Present- Decreased Memory, Fainting, Headaches, Numbness, Seizures, Tingling, Tremor, Trouble walking and Weakness. Psychiatric Not Present- Anxiety, Bipolar, Change in Sleep Pattern, Depression, Fearful and Frequent crying. Endocrine Not Present- Cold Intolerance, Excessive Hunger, Hair Changes, Heat Intolerance, Hot flashes and New Diabetes. Hematology Not Present- Blood Thinners, Easy Bruising, Excessive bleeding, Gland problems, HIV and Persistent Infections.  Vitals (April Staton CMA; 01/07/2017 2:17 PM) 01/07/2017 2:17 PM Weight: 240 lb Height: 74in Body Surface Area: 2.35 m Body Mass Index: 30.81 kg/m   Pulse: 75 (Regular)  P.OX: 96% (Room air) BP: 122/88 (Sitting, Left Arm, Standard)      Physical Exam Leighton Ruff MD; 04/11/9627 2:50 PM)  General Mental Status-Alert. General Appearance-Not in acute distress. Build & Nutrition-Well nourished. Posture-Normal posture. Gait-Normal.  Head and  Neck Head-normocephalic, atraumatic with no lesions or palpable masses. Trachea-midline.  Chest and Lung Exam Chest and lung exam reveals -on auscultation, normal breath sounds, no adventitious sounds and normal vocal resonance.  Cardiovascular Cardiovascular examination reveals -normal heart sounds, regular rate and rhythm with no murmurs and femoral artery auscultation bilaterally reveals normal pulses, no bruits, no thrills.  Abdomen Inspection Inspection of the abdomen reveals - No Hernias. Palpation/Percussion Palpation and Percussion of the abdomen reveal - Soft, Non Tender, No Rigidity (guarding), No hepatosplenomegaly and No Palpable abdominal masses.  Rectal Anorectal Exam Internal - Note: no mass palpated.  Neurologic Neurologic evaluation reveals -alert and oriented x 3 with no impairment of recent or remote memory, normal attention span and ability to concentrate, normal sensation and normal coordination.  Musculoskeletal Normal Exam - Bilateral-Upper Extremity Strength Normal and Lower Extremity Strength Normal.    Assessment & Plan Leighton Ruff MD; 07/13/4126 2:52 PM)  RECTAL CANCER (C20) Impression: 47 year old male with recent diagnosis of rectal cancer. This is approximate 6 cm from the anal verge. Ultrasound staging is T2 N0. CT scans are neg for metastatic disease. CEA was mildly elevated. The lesion is tatooed. Since he is an early stage rectal cancer, I recommended proceeding with low anterior resection. We discussed the need for possible diverting ileostomy as well. If he has not stopped smoking by then, we will be more likely to need  this. The surgery and anatomy were described to the patient as well as the risks of surgery and the possible complications. These include: Bleeding, deep abdominal infections and possible wound complications such as hernia and infection, damage to adjacent structures, leak of surgical connections, which can lead to other surgeries and possibly an ostomy, possible need for other procedures, such as abscess drains in radiology, possible prolonged hospital stay, possible diarrhea from removal of part of the colon, possible constipation from narcotics, possible bowel, bladder or sexual dysfunction if having rectal surgery, prolonged fatigue/weakness or appetite loss, possible early recurrence of of disease, possible complications of their medical problems such as heart disease or arrhythmias or lung problems, death (less than 1%). I believe the patient understands and wishes to proceed with the surgery.

## 2017-01-12 ENCOUNTER — Ambulatory Visit: Payer: BLUE CROSS/BLUE SHIELD | Admitting: Oncology

## 2017-01-18 ENCOUNTER — Ambulatory Visit: Payer: BLUE CROSS/BLUE SHIELD | Admitting: Radiation Oncology

## 2017-01-19 ENCOUNTER — Ambulatory Visit: Payer: BLUE CROSS/BLUE SHIELD

## 2017-01-20 ENCOUNTER — Ambulatory Visit: Payer: BLUE CROSS/BLUE SHIELD

## 2017-01-21 ENCOUNTER — Ambulatory Visit: Payer: BLUE CROSS/BLUE SHIELD

## 2017-01-22 ENCOUNTER — Ambulatory Visit: Payer: BLUE CROSS/BLUE SHIELD

## 2017-01-22 NOTE — Patient Instructions (Signed)
Lucas Morrison  01/22/2017   Your procedure is scheduled on: 01-29-17  Report to [facility] Main  Entrance    Report to admitting at [facility]   Call this number if you have problems the morning of surgery [phone]     Remember: Do not eat food or drink liquids :After Midnight.     Take these medicines the morning of surgery with A SIP OF WATER: ezetimibe(zetia)                                 You may not have any metal on your body including hair pins and              piercings  Do not wear jewelry, make-up, lotions, powders or perfumes, deodorant              Men may shave face and neck.   Do not bring valuables to the hospital. [place].  Contacts, dentures or bridgework may not be worn into surgery.  Leave suitcase in the car. After surgery it may be brought to your room.                Please read over the following fact sheets you were given: _____________________________________________________________________             [facility] - Preparing for Surgery Before surgery, you can play an important role.  Because skin is not sterile, your skin needs to be as free of germs as possible.  You can reduce the number of germs on your skin by washing with CHG (chlorahexidine gluconate) soap before surgery.  CHG is an antiseptic cleaner which kills germs and bonds with the skin to continue killing germs even after washing. Please DO NOT use if you have an allergy to CHG or antibacterial soaps.  If your skin becomes reddened/irritated stop using the CHG and inform your nurse when you arrive at Short Stay. Do not shave (including legs and underarms) for at least 48 hours prior to the first CHG shower.  You may shave your face/neck. Please follow these instructions carefully:  1.  Shower with CHG Soap the night before surgery and the  morning of Surgery.  2.  If you choose to wash your hair, wash your hair first  as usual with your  normal  shampoo.  3.  After you shampoo, rinse your hair and body thoroughly to remove the  shampoo.                           4.  Use CHG as you would any other liquid soap.  You can apply chg directly  to the skin and wash                       Gently with a scrungie or clean washcloth.  5.  Apply the CHG Soap to your body ONLY FROM THE NECK DOWN.   Do not use on face/ open                           Wound or open sores. Avoid contact with eyes, ears mouth  and genitals (private parts).                       Wash face,  Genitals (private parts) with your normal soap.             6.  Wash thoroughly, paying special attention to the area where your surgery  will be performed.  7.  Thoroughly rinse your body with warm water from the neck down.  8.  DO NOT shower/wash with your normal soap after using and rinsing off  the CHG Soap.                9.  Pat yourself dry with a clean towel.            10.  Wear clean pajamas.            11.  Place clean sheets on your bed the night of your first shower and do not  sleep with pets. Day of Surgery : Do not apply any lotions/deodorants the morning of surgery.  Please wear clean clothes to the hospital/surgery center.  FAILURE TO FOLLOW THESE INSTRUCTIONS MAY RESULT IN THE CANCELLATION OF YOUR SURGERY PATIENT SIGNATURE_________________________________  NURSE SIGNATURE__________________________________  ________________________________________________________________________   Lucas Morrison  An incentive spirometer is a tool that can help keep your lungs clear and active. This tool measures how well you are filling your lungs with each breath. Taking long deep breaths may help reverse or decrease the chance of developing breathing (pulmonary) problems (especially infection) following:  A long period of time when you are unable to move or be active. BEFORE THE PROCEDURE   If the spirometer includes an indicator to show your  best effort, your nurse or respiratory therapist will set it to a desired goal.  If possible, sit up straight or lean slightly forward. Try not to slouch.  Hold the incentive spirometer in an upright position. INSTRUCTIONS FOR USE  1. Sit on the edge of your bed if possible, or sit up as far as you can in bed or on a chair. 2. Hold the incentive spirometer in an upright position. 3. Breathe out normally. 4. Place the mouthpiece in your mouth and seal your lips tightly around it. 5. Breathe in slowly and as deeply as possible, raising the piston or the ball toward the top of the column. 6. Hold your breath for 3-5 seconds or for as long as possible. Allow the piston or ball to fall to the bottom of the column. 7. Remove the mouthpiece from your mouth and breathe out normally. 8. Rest for a few seconds and repeat Steps 1 through 7 at least 10 times every 1-2 hours when you are awake. Take your time and take a few normal breaths between deep breaths. 9. The spirometer may include an indicator to show your best effort. Use the indicator as a goal to work toward during each repetition. 10. After each set of 10 deep breaths, practice coughing to be sure your lungs are clear. If you have an incision (the cut made at the time of surgery), support your incision when coughing by placing a pillow or rolled up towels firmly against it. Once you are able to get out of bed, walk around indoors and cough well. You may stop using the incentive spirometer when instructed by your caregiver.  RISKS AND COMPLICATIONS  Take your time so you do not get dizzy or light-headed.  If you are in pain, you  may need to take or ask for pain medication before doing incentive spirometry. It is harder to take a deep breath if you are having pain. AFTER USE  Rest and breathe slowly and easily.  It can be helpful to keep track of a log of your progress. Your caregiver can provide you with a simple table to help with this. If  you are using the spirometer at home, follow these instructions: [place] IF:   You are having difficultly using the spirometer.  You have trouble using the spirometer as often as instructed.  Your pain medication is not giving enough relief while using the spirometer.  You develop fever of 100.5 F (38.1 C) or higher. SEEK IMMEDIATE MEDICAL CARE IF:   You cough up bloody sputum that had not been present before.  You develop fever of 102 F (38.9 C) or greater.  You develop worsening pain at or near the incision site. MAKE SURE YOU:   Understand these instructions.  Will watch your condition.  Will get help right away if you are not doing well or get worse. Document Released: 05/04/2006 Document Revised: 03/16/2011 Document Reviewed: 07/05/2006 [facility] Patient Information 2014 [place], [place].   ________________________________________________________________________

## 2017-01-22 NOTE — Progress Notes (Signed)
CT Chest 12-25-16 epic

## 2017-01-25 ENCOUNTER — Ambulatory Visit: Payer: BLUE CROSS/BLUE SHIELD

## 2017-01-26 ENCOUNTER — Other Ambulatory Visit: Payer: Self-pay

## 2017-01-26 ENCOUNTER — Encounter (HOSPITAL_COMMUNITY): Payer: Self-pay

## 2017-01-26 ENCOUNTER — Ambulatory Visit: Payer: BLUE CROSS/BLUE SHIELD

## 2017-01-26 ENCOUNTER — Encounter (HOSPITAL_COMMUNITY)
Admission: RE | Admit: 2017-01-26 | Discharge: 2017-01-26 | Disposition: A | Discharge status: Home / Self Care | Payer: BLUE CROSS/BLUE SHIELD | Source: Ambulatory Visit | Attending: General Surgery | Admitting: General Surgery

## 2017-01-26 DIAGNOSIS — F17200 Nicotine dependence, unspecified, uncomplicated: Secondary | ICD-10-CM

## 2017-01-26 LAB — CBC
HCT: 44.5 % (ref 39.0–52.0)
HEMOGLOBIN: 14.9 g/dL (ref 13.0–17.0)
MCH: 28.5 pg (ref 26.0–34.0)
MCHC: 33.5 g/dL (ref 30.0–36.0)
MCV: 85.1 fL (ref 78.0–100.0)
PLATELETS: 247 10*3/uL (ref 150–400)
RBC: 5.23 MIL/uL (ref 4.22–5.81)
RDW: 13 % (ref 11.5–15.5)
WBC: 5.9 10*3/uL (ref 4.0–10.5)

## 2017-01-26 LAB — BASIC METABOLIC PANEL
ANION GAP: 7 (ref 5–15)
BUN: 14 mg/dL (ref 6–20)
CALCIUM: 9.4 mg/dL (ref 8.9–10.3)
CO2: 24 mmol/L (ref 22–32)
Chloride: 106 mmol/L (ref 101–111)
Creatinine, Ser: 1.08 mg/dL (ref 0.61–1.24)
GFR calc non Af Amer: >60 mL/min (ref >60)
GLUCOSE: 98 mg/dL (ref 65–99)
Potassium: 4 mmol/L (ref 3.5–5.1)
Sodium: 137 mmol/L (ref 135–145)

## 2017-01-26 LAB — ABO/RH: ABO/RH(D): A POS

## 2017-01-27 ENCOUNTER — Ambulatory Visit: Payer: BLUE CROSS/BLUE SHIELD

## 2017-01-28 ENCOUNTER — Ambulatory Visit: Payer: BLUE CROSS/BLUE SHIELD

## 2017-01-28 MED ORDER — BUPIVACAINE LIPOSOME 1.3 % IJ SUSP
20.0000 mL | INTRAMUSCULAR | Status: DC
  Filled 2017-01-28: qty 20

## 2017-01-29 ENCOUNTER — Inpatient Hospital Stay (HOSPITAL_COMMUNITY): Payer: BLUE CROSS/BLUE SHIELD | Admitting: Anesthesiology

## 2017-01-29 ENCOUNTER — Encounter (HOSPITAL_COMMUNITY): Payer: Self-pay | Admitting: Emergency Medicine

## 2017-01-29 ENCOUNTER — Other Ambulatory Visit: Payer: Self-pay

## 2017-01-29 ENCOUNTER — Encounter (HOSPITAL_COMMUNITY)
Admission: RE | Disposition: A | Discharge status: Home / Self Care | Payer: Self-pay | Source: Ambulatory Visit | Attending: General Surgery

## 2017-01-29 ENCOUNTER — Ambulatory Visit: Payer: BLUE CROSS/BLUE SHIELD

## 2017-01-29 ENCOUNTER — Inpatient Hospital Stay (HOSPITAL_COMMUNITY)
Admission: RE | Admit: 2017-01-29 | Discharge: 2017-02-02 | DRG: 330 | Disposition: A | Discharge status: Home / Self Care | Payer: BLUE CROSS/BLUE SHIELD | Source: Ambulatory Visit | Attending: General Surgery | Admitting: General Surgery

## 2017-01-29 DIAGNOSIS — E7800 Pure hypercholesterolemia, unspecified: Secondary | ICD-10-CM | POA: Diagnosis present

## 2017-01-29 DIAGNOSIS — F17200 Nicotine dependence, unspecified, uncomplicated: Secondary | ICD-10-CM | POA: Diagnosis present

## 2017-01-29 DIAGNOSIS — Z79899 Other long term (current) drug therapy: Secondary | ICD-10-CM | POA: Diagnosis not present

## 2017-01-29 DIAGNOSIS — Q438 Other specified congenital malformations of intestine: Secondary | ICD-10-CM | POA: Diagnosis not present

## 2017-01-29 DIAGNOSIS — E079 Disorder of thyroid, unspecified: Secondary | ICD-10-CM | POA: Diagnosis present

## 2017-01-29 DIAGNOSIS — C20 Malignant neoplasm of rectum: Secondary | ICD-10-CM | POA: Diagnosis present

## 2017-01-29 DIAGNOSIS — C19 Malignant neoplasm of rectosigmoid junction: Principal | ICD-10-CM | POA: Diagnosis present

## 2017-01-29 HISTORY — PX: XI ROBOTIC ASSISTED LOWER ANTERIOR RESECTION: SHX6558

## 2017-01-29 HISTORY — PX: PROCTOSCOPY: SHX2266

## 2017-01-29 LAB — TYPE AND SCREEN
ABO/RH(D): A POS
ANTIBODY SCREEN: NEGATIVE

## 2017-01-29 LAB — NICOTINE AND METABS, URINE
COTININE, URINE: 694.2 ng/mL
Nicotine, urine: 263.2 ng/mL

## 2017-01-29 SURGERY — RESECTION, RECTUM, LOW ANTERIOR, ROBOT-ASSISTED
Anesthesia: General | Site: Rectum

## 2017-01-29 MED ORDER — ALVIMOPAN 12 MG PO CAPS
12.0000 mg | ORAL_CAPSULE | ORAL | Status: AC
  Administered 2017-01-29: 12 mg via ORAL
  Filled 2017-01-29: qty 1

## 2017-01-29 MED ORDER — MIDAZOLAM HCL 2 MG/2ML IJ SOLN
INTRAMUSCULAR | Status: DC | PRN
  Administered 2017-01-29: 2 mg via INTRAVENOUS

## 2017-01-29 MED ORDER — ACETAMINOPHEN 500 MG PO TABS
1000.0000 mg | ORAL_TABLET | Freq: Four times a day (QID) | ORAL | Status: DC
  Administered 2017-01-29 – 2017-02-01 (×11): 1000 mg via ORAL
  Filled 2017-01-29 (×12): qty 2

## 2017-01-29 MED ORDER — LIDOCAINE 2% (20 MG/ML) 5 ML SYRINGE
INTRAMUSCULAR | Status: DC | PRN
  Administered 2017-01-29: 1.5 mg/kg/h via INTRAVENOUS

## 2017-01-29 MED ORDER — LIDOCAINE 2% (20 MG/ML) 5 ML SYRINGE: INTRAMUSCULAR | Status: DC | PRN

## 2017-01-29 MED ORDER — FENOFIBRATE 160 MG PO TABS
160.0000 mg | ORAL_TABLET | Freq: Every day | ORAL | Status: DC
  Administered 2017-01-30 – 2017-02-01 (×3): 160 mg via ORAL
  Filled 2017-01-29 (×4): qty 1

## 2017-01-29 MED ORDER — KCL IN DEXTROSE-NACL 20-5-0.45 MEQ/L-%-% IV SOLN
INTRAVENOUS | Status: DC
  Administered 2017-01-29: 17:00:00 via INTRAVENOUS
  Administered 2017-01-30: 1000 mL via INTRAVENOUS
  Administered 2017-01-31: 04:00:00 via INTRAVENOUS
  Filled 2017-01-29 (×3): qty 1000

## 2017-01-29 MED ORDER — PROPOFOL 10 MG/ML IV BOLUS
INTRAVENOUS | Status: DC | PRN
  Administered 2017-01-29: 200 mg via INTRAVENOUS

## 2017-01-29 MED ORDER — LIDOCAINE HCL 2 % IJ SOLN
INTRAMUSCULAR | Status: AC
  Filled 2017-01-29: qty 20

## 2017-01-29 MED ORDER — PROMETHAZINE HCL 25 MG/ML IJ SOLN: 6.2500 mg | INTRAMUSCULAR | Status: DC | PRN

## 2017-01-29 MED ORDER — ROCURONIUM BROMIDE 50 MG/5ML IV SOSY
PREFILLED_SYRINGE | INTRAVENOUS | Status: AC
  Filled 2017-01-29: qty 5

## 2017-01-29 MED ORDER — ONDANSETRON HCL 4 MG/2ML IJ SOLN
INTRAMUSCULAR | Status: DC | PRN
  Administered 2017-01-29: 4 mg via INTRAVENOUS

## 2017-01-29 MED ORDER — DIPHENHYDRAMINE HCL 25 MG PO CAPS: 25.0000 mg | ORAL_CAPSULE | Freq: Four times a day (QID) | ORAL | Status: DC | PRN

## 2017-01-29 MED ORDER — ENOXAPARIN SODIUM 40 MG/0.4ML ~~LOC~~ SOLN
40.0000 mg | SUBCUTANEOUS | Status: DC
  Administered 2017-01-30 – 2017-02-02 (×4): 40 mg via SUBCUTANEOUS
  Filled 2017-01-29 (×4): qty 0.4

## 2017-01-29 MED ORDER — GABAPENTIN 300 MG PO CAPS
300.0000 mg | ORAL_CAPSULE | ORAL | Status: AC
  Administered 2017-01-29: 300 mg via ORAL
  Filled 2017-01-29: qty 1

## 2017-01-29 MED ORDER — BUPIVACAINE LIPOSOME 1.3 % IJ SUSP
INTRAMUSCULAR | Status: DC | PRN
  Administered 2017-01-29: 20 mL

## 2017-01-29 MED ORDER — SUFENTANIL CITRATE 50 MCG/ML IV SOLN
INTRAVENOUS | Status: DC | PRN
  Administered 2017-01-29: 10 ug via INTRAVENOUS
  Administered 2017-01-29: 20 ug via INTRAVENOUS
  Administered 2017-01-29 (×4): 10 ug via INTRAVENOUS

## 2017-01-29 MED ORDER — SUGAMMADEX SODIUM 500 MG/5ML IV SOLN
INTRAVENOUS | Status: AC
  Filled 2017-01-29: qty 5

## 2017-01-29 MED ORDER — ROSUVASTATIN CALCIUM 20 MG PO TABS
40.0000 mg | ORAL_TABLET | Freq: Every day | ORAL | Status: DC
  Administered 2017-01-29 – 2017-02-01 (×4): 40 mg via ORAL
  Filled 2017-01-29 (×4): qty 2

## 2017-01-29 MED ORDER — ALVIMOPAN 12 MG PO CAPS: 12.0000 mg | ORAL_CAPSULE | Freq: Two times a day (BID) | ORAL | Status: DC

## 2017-01-29 MED ORDER — MEPERIDINE HCL 50 MG/ML IJ SOLN: 6.2500 mg | INTRAMUSCULAR | Status: DC | PRN

## 2017-01-29 MED ORDER — ORAL CARE MOUTH RINSE
15.0000 mL | Freq: Two times a day (BID) | OROMUCOSAL | Status: DC
  Administered 2017-01-29 – 2017-01-31 (×2): 15 mL via OROMUCOSAL

## 2017-01-29 MED ORDER — EPHEDRINE 5 MG/ML INJ
INTRAVENOUS | Status: AC
  Filled 2017-01-29: qty 10

## 2017-01-29 MED ORDER — ONDANSETRON HCL 4 MG PO TABS: 4.0000 mg | ORAL_TABLET | Freq: Four times a day (QID) | ORAL | Status: DC | PRN

## 2017-01-29 MED ORDER — SUFENTANIL CITRATE 50 MCG/ML IV SOLN
INTRAVENOUS | Status: AC
  Filled 2017-01-29: qty 1

## 2017-01-29 MED ORDER — SUGAMMADEX SODIUM 200 MG/2ML IV SOLN
INTRAVENOUS | Status: DC | PRN
  Administered 2017-01-29: 250 mg via INTRAVENOUS

## 2017-01-29 MED ORDER — EZETIMIBE 10 MG PO TABS
10.0000 mg | ORAL_TABLET | Freq: Every day | ORAL | Status: DC
  Administered 2017-01-30 – 2017-02-02 (×4): 10 mg via ORAL
  Filled 2017-01-29 (×4): qty 1

## 2017-01-29 MED ORDER — DEXTROSE 5 % IV SOLN
2.0000 g | Freq: Two times a day (BID) | INTRAVENOUS | Status: AC
  Administered 2017-01-29: 2 g via INTRAVENOUS
  Filled 2017-01-29: qty 2

## 2017-01-29 MED ORDER — DIPHENHYDRAMINE HCL 50 MG/ML IJ SOLN: 25.0000 mg | Freq: Four times a day (QID) | INTRAMUSCULAR | Status: DC | PRN

## 2017-01-29 MED ORDER — DEXAMETHASONE SODIUM PHOSPHATE 10 MG/ML IJ SOLN
INTRAMUSCULAR | Status: DC | PRN
  Administered 2017-01-29: 10 mg via INTRAVENOUS

## 2017-01-29 MED ORDER — CEFOTETAN DISODIUM-DEXTROSE 2-2.08 GM-%(50ML) IV SOLR
2.0000 g | INTRAVENOUS | Status: AC
  Administered 2017-01-29: 2 g via INTRAVENOUS
  Filled 2017-01-29: qty 50

## 2017-01-29 MED ORDER — PROPOFOL 10 MG/ML IV BOLUS
INTRAVENOUS | Status: AC
  Filled 2017-01-29: qty 20

## 2017-01-29 MED ORDER — BUPIVACAINE HCL (PF) 0.25 % IJ SOLN
INTRAMUSCULAR | Status: DC | PRN
  Administered 2017-01-29: 30 mL

## 2017-01-29 MED ORDER — SACCHAROMYCES BOULARDII 250 MG PO CAPS
250.0000 mg | ORAL_CAPSULE | Freq: Two times a day (BID) | ORAL | Status: DC
  Administered 2017-01-29 – 2017-01-31 (×5): 250 mg via ORAL
  Administered 2017-02-01: 1 mg via ORAL
  Administered 2017-02-01 – 2017-02-02 (×2): 250 mg via ORAL
  Filled 2017-01-29 (×8): qty 1

## 2017-01-29 MED ORDER — KETAMINE HCL 10 MG/ML IJ SOLN
INTRAMUSCULAR | Status: AC
  Filled 2017-01-29: qty 1

## 2017-01-29 MED ORDER — LACTATED RINGERS IR SOLN
Status: DC | PRN
  Administered 2017-01-29: 1000 mL

## 2017-01-29 MED ORDER — DEXAMETHASONE SODIUM PHOSPHATE 10 MG/ML IJ SOLN
INTRAMUSCULAR | Status: AC
  Filled 2017-01-29: qty 1

## 2017-01-29 MED ORDER — SODIUM CHLORIDE 0.9 % IJ SOLN
INTRAMUSCULAR | Status: AC
  Filled 2017-01-29: qty 10

## 2017-01-29 MED ORDER — HYDROMORPHONE HCL 1 MG/ML IJ SOLN: 0.5000 mg | INTRAMUSCULAR | Status: DC | PRN

## 2017-01-29 MED ORDER — LACTATED RINGERS IV SOLN
INTRAVENOUS | Status: DC | PRN
  Administered 2017-01-29 (×2): via INTRAVENOUS

## 2017-01-29 MED ORDER — ONDANSETRON HCL 4 MG/2ML IJ SOLN: 4.0000 mg | Freq: Four times a day (QID) | INTRAMUSCULAR | Status: DC | PRN

## 2017-01-29 MED ORDER — HEPARIN SODIUM (PORCINE) 5000 UNIT/ML IJ SOLN
5000.0000 [IU] | Freq: Once | INTRAMUSCULAR | Status: AC
  Administered 2017-01-29: 5000 [IU] via SUBCUTANEOUS
  Filled 2017-01-29: qty 1

## 2017-01-29 MED ORDER — MIDAZOLAM HCL 2 MG/2ML IJ SOLN
INTRAMUSCULAR | Status: AC
  Filled 2017-01-29: qty 2

## 2017-01-29 MED ORDER — BUPIVACAINE HCL (PF) 0.25 % IJ SOLN
INTRAMUSCULAR | Status: AC
  Filled 2017-01-29: qty 30

## 2017-01-29 MED ORDER — LACTATED RINGERS IV SOLN
INTRAVENOUS | Status: DC
  Administered 2017-01-29: 09:00:00 via INTRAVENOUS

## 2017-01-29 MED ORDER — ROCURONIUM BROMIDE 10 MG/ML (PF) SYRINGE
PREFILLED_SYRINGE | INTRAVENOUS | Status: DC | PRN
  Administered 2017-01-29 (×2): 20 mg via INTRAVENOUS
  Administered 2017-01-29: 50 mg via INTRAVENOUS
  Administered 2017-01-29: 10 mg via INTRAVENOUS
  Administered 2017-01-29: 20 mg via INTRAVENOUS

## 2017-01-29 MED ORDER — ONDANSETRON HCL 4 MG/2ML IJ SOLN
INTRAMUSCULAR | Status: AC
  Filled 2017-01-29: qty 2

## 2017-01-29 MED ORDER — ACETAMINOPHEN 500 MG PO TABS
1000.0000 mg | ORAL_TABLET | ORAL | Status: AC
  Administered 2017-01-29: 1000 mg via ORAL
  Filled 2017-01-29: qty 2

## 2017-01-29 MED ORDER — KETAMINE HCL 10 MG/ML IJ SOLN
INTRAMUSCULAR | Status: DC | PRN
  Administered 2017-01-29: 50 mg via INTRAVENOUS

## 2017-01-29 MED ORDER — KETOROLAC TROMETHAMINE 30 MG/ML IJ SOLN
INTRAMUSCULAR | Status: AC
  Administered 2017-01-29: 30 mg via INTRAVENOUS
  Filled 2017-01-29: qty 1

## 2017-01-29 MED ORDER — EPHEDRINE SULFATE-NACL 50-0.9 MG/10ML-% IV SOSY
PREFILLED_SYRINGE | INTRAVENOUS | Status: DC | PRN
  Administered 2017-01-29: 10 mg via INTRAVENOUS
  Administered 2017-01-29 (×2): 5 mg via INTRAVENOUS

## 2017-01-29 MED ORDER — LIDOCAINE 2% (20 MG/ML) 5 ML SYRINGE
INTRAMUSCULAR | Status: DC | PRN
  Administered 2017-01-29: 100 mg via INTRAVENOUS

## 2017-01-29 MED ORDER — LIDOCAINE 2% (20 MG/ML) 5 ML SYRINGE
INTRAMUSCULAR | Status: AC
  Filled 2017-01-29: qty 5

## 2017-01-29 MED ORDER — ALUM & MAG HYDROXIDE-SIMETH 200-200-20 MG/5ML PO SUSP: 30.0000 mL | Freq: Four times a day (QID) | ORAL | Status: DC | PRN

## 2017-01-29 MED ORDER — CELECOXIB 200 MG PO CAPS
200.0000 mg | ORAL_CAPSULE | ORAL | Status: AC
  Administered 2017-01-29: 200 mg via ORAL
  Filled 2017-01-29: qty 1

## 2017-01-29 MED ORDER — HYDROMORPHONE HCL 1 MG/ML IJ SOLN: 0.2500 mg | INTRAMUSCULAR | Status: DC | PRN

## 2017-01-29 MED ORDER — SUCCINYLCHOLINE CHLORIDE 200 MG/10ML IV SOSY
PREFILLED_SYRINGE | INTRAVENOUS | Status: AC
  Filled 2017-01-29: qty 10

## 2017-01-29 MED ORDER — KETOROLAC TROMETHAMINE 30 MG/ML IJ SOLN
30.0000 mg | Freq: Once | INTRAMUSCULAR | Status: DC | PRN
  Administered 2017-01-29: 30 mg via INTRAVENOUS

## 2017-01-29 MED ORDER — 0.9 % SODIUM CHLORIDE (POUR BTL) OPTIME
TOPICAL | Status: DC | PRN
  Administered 2017-01-29: 2000 mL

## 2017-01-29 SURGICAL SUPPLY — 97 items
BLADE EXTENDED COATED 6.5IN (ELECTRODE) IMPLANT
CANNULA REDUC XI 12-8 STAPL (CANNULA) ×1
CANNULA REDUCER 12-8 DVNC XI (CANNULA) ×2 IMPLANT
CATH ROBINSON RED A/P 16FR (CATHETERS) ×3 IMPLANT
CELLS DAT CNTRL 66122 CELL SVR (MISCELLANEOUS) IMPLANT
CLIP VESOLOCK LG 6/CT PURPLE (CLIP) IMPLANT
CLIP VESOLOCK MED 6/CT (CLIP) IMPLANT
COVER MAYO STAND STRL (DRAPES) ×6 IMPLANT
COVER SURGICAL LIGHT HANDLE (MISCELLANEOUS) ×3 IMPLANT
COVER TIP SHEARS 8 DVNC (MISCELLANEOUS) ×2 IMPLANT
COVER TIP SHEARS 8MM DA VINCI (MISCELLANEOUS) ×1
DECANTER SPIKE VIAL GLASS SM (MISCELLANEOUS) ×3 IMPLANT
DERMABOND ADVANCED (GAUZE/BANDAGES/DRESSINGS) ×1
DERMABOND ADVANCED .7 DNX12 (GAUZE/BANDAGES/DRESSINGS) ×2 IMPLANT
DRAIN CHANNEL 19F RND (DRAIN) ×3 IMPLANT
DRAPE ARM DVNC X/XI (DISPOSABLE) ×8 IMPLANT
DRAPE COLUMN DVNC XI (DISPOSABLE) ×2 IMPLANT
DRAPE DA VINCI XI ARM (DISPOSABLE) ×4
DRAPE DA VINCI XI COLUMN (DISPOSABLE) ×1
DRAPE SURG IRRIG POUCH 19X23 (DRAPES) ×3 IMPLANT
DRSG OPSITE POSTOP 4X10 (GAUZE/BANDAGES/DRESSINGS) IMPLANT
DRSG OPSITE POSTOP 4X6 (GAUZE/BANDAGES/DRESSINGS) IMPLANT
DRSG OPSITE POSTOP 4X8 (GAUZE/BANDAGES/DRESSINGS) IMPLANT
ELECT PENCIL ROCKER SW 15FT (MISCELLANEOUS) ×6 IMPLANT
ELECT REM PT RETURN 15FT ADLT (MISCELLANEOUS) ×3 IMPLANT
ENDOLOOP SUT PDS II  0 18 (SUTURE)
ENDOLOOP SUT PDS II 0 18 (SUTURE) IMPLANT
EVACUATOR SILICONE 100CC (DRAIN) ×3 IMPLANT
GAUZE SPONGE 4X4 12PLY STRL (GAUZE/BANDAGES/DRESSINGS) IMPLANT
GLOVE BIO SURGEON STRL SZ 6.5 (GLOVE) ×9 IMPLANT
GLOVE BIOGEL PI IND STRL 7.0 (GLOVE) ×6 IMPLANT
GLOVE BIOGEL PI INDICATOR 7.0 (GLOVE) ×3
GOWN STRL REUS W/TWL 2XL LVL3 (GOWN DISPOSABLE) ×9 IMPLANT
GOWN STRL REUS W/TWL XL LVL3 (GOWN DISPOSABLE) ×12 IMPLANT
GRASPER ENDOPATH ANVIL 10MM (MISCELLANEOUS) IMPLANT
GRASPER SUT TROCAR 14GX15 (MISCELLANEOUS) IMPLANT
HOLDER FOLEY CATH W/STRAP (MISCELLANEOUS) ×3 IMPLANT
IRRIG SUCT STRYKERFLOW 2 WTIP (MISCELLANEOUS) ×3
IRRIGATION SUCT STRKRFLW 2 WTP (MISCELLANEOUS) ×2 IMPLANT
IRRIGATOR SUCT 8 DISP DVNC XI (IRRIGATION / IRRIGATOR) ×2 IMPLANT
IRRIGATOR SUCTION 8MM XI DISP (IRRIGATION / IRRIGATOR) ×1
KIT PROCEDURE DA VINCI SI (MISCELLANEOUS) ×1
KIT PROCEDURE DVNC SI (MISCELLANEOUS) ×2 IMPLANT
NEEDLE INSUFFLATION 14GA 120MM (NEEDLE) ×3 IMPLANT
PACK CARDIOVASCULAR III (CUSTOM PROCEDURE TRAY) ×3 IMPLANT
PACK COLON (CUSTOM PROCEDURE TRAY) ×3 IMPLANT
PORT LAP GEL ALEXIS MED 5-9CM (MISCELLANEOUS) IMPLANT
POUCH OSTOMY FLEX CONVEX 2-1/8 (OSTOMY) ×3 IMPLANT
RTRCTR WOUND ALEXIS 18CM MED (MISCELLANEOUS)
SCISSORS METZENBAUM CVD 33 (INSTRUMENTS) ×3 IMPLANT
SEAL CANN UNIV 5-8 DVNC XI (MISCELLANEOUS) ×8 IMPLANT
SEAL XI 5MM-8MM UNIVERSAL (MISCELLANEOUS) ×4
SEALER VESSEL DA VINCI XI (MISCELLANEOUS) ×1
SEALER VESSEL EXT DVNC XI (MISCELLANEOUS) ×2 IMPLANT
SLEEVE ADV FIXATION 5X100MM (TROCAR) IMPLANT
SOLUTION ELECTROLUBE (MISCELLANEOUS) ×3 IMPLANT
SPONGE DRAIN TRACH 4X4 STRL 2S (GAUZE/BANDAGES/DRESSINGS) ×3 IMPLANT
STAPLER 45 BLU RELOAD XI (STAPLE) IMPLANT
STAPLER 45 BLUE RELOAD XI (STAPLE)
STAPLER 45 GREEN RELOAD XI (STAPLE) ×3
STAPLER 45 GRN RELOAD XI (STAPLE) ×6 IMPLANT
STAPLER CANNULA SEAL DVNC XI (STAPLE) ×2 IMPLANT
STAPLER CANNULA SEAL XI (STAPLE) ×1
STAPLER CIRC ILS CVD 33MM 37CM (STAPLE) ×3 IMPLANT
STAPLER SHEATH (SHEATH) ×1
STAPLER SHEATH ENDOWRIST DVNC (SHEATH) ×2 IMPLANT
STAPLER VISISTAT 35W (STAPLE) IMPLANT
SUT ETHILON 2 0 PS N (SUTURE) ×3 IMPLANT
SUT NOVA NAB GS-21 0 18 T12 DT (SUTURE) IMPLANT
SUT NOVA NAB GS-21 1 T12 (SUTURE) IMPLANT
SUT PDS AB 1 CTX 36 (SUTURE) IMPLANT
SUT PDS AB 1 TP1 96 (SUTURE) IMPLANT
SUT PROLENE 2 0 KS (SUTURE) ×3 IMPLANT
SUT SILK 2 0 (SUTURE) ×1
SUT SILK 2 0 SH CR/8 (SUTURE) ×3 IMPLANT
SUT SILK 2-0 18XBRD TIE 12 (SUTURE) ×2 IMPLANT
SUT SILK 3 0 (SUTURE) ×1
SUT SILK 3 0 SH CR/8 (SUTURE) ×3 IMPLANT
SUT SILK 3-0 18XBRD TIE 12 (SUTURE) ×2 IMPLANT
SUT V-LOC BARB 180 2/0GR6 GS22 (SUTURE)
SUT VIC AB 2-0 SH 18 (SUTURE) ×12 IMPLANT
SUT VIC AB 2-0 SH 27 (SUTURE) ×1
SUT VIC AB 2-0 SH 27X BRD (SUTURE) ×2 IMPLANT
SUT VIC AB 3-0 SH 18 (SUTURE) ×3 IMPLANT
SUT VIC AB 4-0 PS2 18 (SUTURE) ×6 IMPLANT
SUT VIC AB 4-0 PS2 27 (SUTURE) ×6 IMPLANT
SUT VICRYL 0 UR6 27IN ABS (SUTURE) ×3 IMPLANT
SUTURE V-LC BRB 180 2/0GR6GS22 (SUTURE) IMPLANT
SYR 10ML ECCENTRIC (SYRINGE) ×3 IMPLANT
SYS LAPSCP GELPORT 120MM (MISCELLANEOUS)
SYSTEM LAPSCP GELPORT 120MM (MISCELLANEOUS) IMPLANT
TOWEL OR 17X26 10 PK STRL BLUE (TOWEL DISPOSABLE) ×3 IMPLANT
TOWEL OR NON WOVEN STRL DISP B (DISPOSABLE) ×3 IMPLANT
TRAY FOLEY W/METER SILVER 16FR (SET/KITS/TRAYS/PACK) ×3 IMPLANT
TROCAR ADV FIXATION 5X100MM (TROCAR) ×3 IMPLANT
TUBING CONNECTING 10 (TUBING) ×6 IMPLANT
TUBING INSUFFLATION 10FT LAP (TUBING) ×3 IMPLANT

## 2017-01-29 NOTE — Interval H&P Note (Signed)
History and Physical Interval Note:  01/29/2017 9:00 AM  Lucas Morrison  has presented today for surgery, with the diagnosis of rectal cancer  The various methods of treatment have been discussed with the patient and family. After consideration of risks, benefits and other options for treatment, the patient has consented to  Procedure(s): XI ROBOTIC [place] (N/A) as a surgical intervention .  The patient's history has been reviewed, patient examined, no change in status, stable for surgery.  I have reviewed the patient's chart and labs.  Questions were answered to the patient's satisfaction.     Rosario Adie, MD  [facility]

## 2017-01-29 NOTE — Anesthesia Preprocedure Evaluation (Signed)
Anesthesia Evaluation  Patient identified by MRN, date of birth, ID band Patient awake    Reviewed: Allergy & Precautions, NPO status , Patient's Chart, lab work & pertinent test results  Airway Mallampati: I       Dental  (+) Missing, Poor Dentition,    Pulmonary Current Smoker,    Pulmonary exam normal breath sounds clear to auscultation       Cardiovascular negative cardio ROS   Rhythm:Regular Rate:Normal     Neuro/Psych negative neurological ROS  negative psych ROS   GI/Hepatic negative GI ROS, Neg liver ROS,   Endo/Other  negative endocrine ROS  Renal/GU negative Renal ROS  negative genitourinary   Musculoskeletal   Abdominal Normal abdominal exam  (+)   Peds  Hematology negative hematology ROS (+)   Anesthesia Other Findings   Reproductive/Obstetrics                             Anesthesia Physical Anesthesia Plan  ASA: II  Anesthesia Plan: General   Post-op Pain Management:    Induction: Intravenous  PONV Risk Score and Plan: 3 and Ondansetron, Dexamethasone and Scopolamine patch - Pre-op  Airway Management Planned: Oral ETT  Additional Equipment:   Intra-op Plan:   Post-operative Plan: Extubation in OR  Informed Consent: I have reviewed the patients History and Physical, chart, labs and discussed the procedure including the risks, benefits and alternatives for the proposed anesthesia with the patient or authorized representative who has indicated his/her understanding and acceptance.   Dental advisory given  Plan Discussed with: CRNA and Surgeon  Anesthesia Plan Comments:         Anesthesia Quick Evaluation

## 2017-01-29 NOTE — Op Note (Signed)
01/29/2017  1:49 PM  PATIENT:  Lucas Morrison  47 y.o. male  Patient Care Team: Samella Parr as PCP - General  PRE-OPERATIVE DIAGNOSIS:  rectal cancer  POST-OPERATIVE DIAGNOSIS:  rectal cancer  PROCEDURE: XI ROBOTIC ASSISTED LOWER ANTERIOR RESECTION WITH LOOP ILEOSTOMY    Surgeon(s): Michael Boston, MD Leighton Ruff, MD  ASSISTANT: Dr Johney [place]   ANESTHESIA:   local and general  EBL: 46ml Total I/O In: 1000 [I.V.:1000] Out: 225 [Urine:150; Blood:75]  Delay start of Pharmacological VTE agent (>24hrs) due to surgical blood loss or risk of bleeding:  no  DRAINS: (44F) Jackson-Pratt drain(s) with closed bulb suction in the pelvis   SPECIMEN:  Source of Specimen:  Rectosigmoid colon  DISPOSITION OF SPECIMEN:  PATHOLOGY  COUNTS:  YES  PLAN OF CARE: Admit to inpatient   PATIENT DISPOSITION:  PACU - hemodynamically stable.  INDICATION:    47 y.o. M with uT2N0 rectal mass ~6cm from the anal verge.  I recommended segmental resection:  The anatomy & physiology of the digestive tract was discussed.  The pathophysiology was discussed.  Natural history risks without surgery was discussed.   I worked to give an overview of the disease and the frequent need to have multispecialty involvement.  I feel the risks of no intervention will lead to serious problems that outweigh the operative risks; therefore, I recommended a partial colectomy to remove the pathology.  Laparoscopic & open techniques were discussed.   Risks such as bleeding, infection, abscess, leak, reoperation, possible ostomy, hernia, heart attack, death, and other risks were discussed.  I noted a good likelihood this will help address the problem.   Goals of post-operative recovery were discussed as well.    The patient expressed understanding & wished to proceed with surgery.  OR FINDINGS:   Patient had posterior midline rectal mass with distal margin ~6cm from the anal margin  No obvious metastatic disease on  visceral parietal peritoneum or liver.  The anastomosis rests 4 cm from the anal verge by rigid proctoscopy.  DESCRIPTION:   Informed consent was confirmed.  The patient underwent general anaesthesia without difficulty.  The patient was positioned appropriately.  VTE prevention in place.  The patient's abdomen was clipped, prepped, & draped in a sterile fashion.  Surgical timeout confirmed our plan.  The patient was positioned in reverse Trendelenburg.  Abdominal entry was gained using a Varies needle in the LUQ.  Entry was clean.  I induced carbon dioxide insufflation.  An 30mm robotic port was placed in the RUQ.  Camera inspection revealed no injury.  Extra ports were carefully placed under direct laparoscopic visualization.  I laparoscopically reflected the greater omentum and the upper abdomen the small bowel in the upper abdomen. The patient was appropriately positioned and the robot was docked to the patient's left side.  Instruments were placed under direct visualization.    I mobilized the sigmoid colon off of the pelvic sidewall.  I scored the base of peritoneum of the right side of the mesentery of the left colon from the ligament of Treitz to the peritoneal reflection of the mid rectum.  The patient had quite a bit of redundant sigmoid colon.  I elevated the sigmoid mesentery and enetered into the retro-mesenteric plane. We were able to identify the left ureter and gonadal vessels. We kept those posterior within the retroperitoneum and elevated the left colon mesentery off that. I did isolated IMA pedicle but did not ligate it yet.  I continued distally and  got into the avascular plane posterior to the mesorectum.  I dissected down to the level of the coccyx.  I continued my dissection laterally.  I mobilized the peritoneal coverings towards the peritoneal reflection on both the right and left sides of the rectum.  I could see the right and left ureters and stayed away from them. I continued this  down to the peritoneal reflection.  I bluntly dissected the anterior rectal plane.  I connected my lateral dissection.  I then came back posteriorly and continued up the posterior midline until the pelvic floor was identified.  The tattoo was noted posteriorly at approximately the level of the coccyx.  I dissected anteriorly underneath the prostate and circumferentially brought my dissection plane around laterally on both sides.   I skeletonized the inferior mesenteric artery pedicle.  I went down to its takeoff from the aorta.   I isolated the inferior mesenteric vein off of the ligament of Treitz just cephalad to that as well.  After confirming the left ureter was out of the way, I went ahead and ligated the inferior mesenteric artery pedicle with bipolar robotic vessel sealer ~2cm above its takeoff from the aorta.  We ensured hemostasis.  I mobilized the left colon in a lateral to medial fashion off the line of Toldt up towards the splenic flexure to ensure good mobilization of the left colon to reach into the pelvis.  I dissected the colon off of the retroperitoneal plane.  This allowed for good mobilization of the remaining colon into the pelvis.  I divided the distal rectum approximately 2 cm distal to the tattoo using 3 green load staplers.  Once this was completed, the rectum was mobilized out of the abdomen.  I checked to make sure that my colon would reach down as an anastomosis.  I then divided the remaining mesentery of the colon to this level using the robotic vessel sealer.  I then identified the terminal ileum.  A red rubber catheter was placed into the abdomen and placed underneath the mesentery of the terminal ileum and held into place with a laparoscopic grasper.  I then brought out my specimen through the right lower quadrant stapler port after enlarging it and placing an [place] wound protector.  I identified a portion of well-perfused colon and placed the pursestring her device.  A 2-0 Prolene  pursestring suture was placed.  The colon was then transected.  With the specimen was then sent to pathology for further examination.  The mucosal edges were bleeding and the marginal artery had perfusion.  I placed a 33 mm EEA stapler into the open colon and tied the pursestring tightly around this.  This was then placed back into the abdomen.  The abdomen was reinsufflated after the Was placed over the [place].  An anastomosis was created with the EEA stapler to the anal canal.  The anastomosis was hemostatic.  There was no leak when tested under water with insufflation.  It rests approximately 4 cm from the anal verge.  A 19 [place] Blake drain was placed into the pelvis and brought out through the right lower quadrant port site.  This was secured into place with a 2-0 Prolene suture.  The omentum was then brought down over the abdominal contents.  The terminal ileum was brought out using the red rubber through the ileostomy site in the right lower quadrant.  I confirmed proximal and distal limbs of the ileostomy.  The abdomen was then desufflated and the ports  were removed.  The Ubaldo Glassing was removed as well.  The fascia that was incised to allow for specimen retrieval medially was reapproximated using interrupted 0 Vicryl sutures.  The skin was closed down using interrupted 2-0 Vicryl sutures.  The port sites were then closed using a 4-0 Vicryl suture and Dermabond.  I then matured the ileostomy in standard [place] fashion using 2-0 Vicryl interrupted sutures.  The red rubber catheter was placed in a O and a 2-0 silk suture was used to connect this.  An ostomy appliance was placed.  The patient was then awakened from anesthesia and sent to the postanesthesia care unit stable condition.  All counts were correct per operating room staff.  An MD assistant was necessary for tissue manipulation, retraction and positioning due to the complexity of the case and hospital policies

## 2017-01-29 NOTE — Anesthesia Procedure Notes (Signed)
Procedure Name: Intubation Date/Time: 01/29/2017 10:45 AM Performed by: Sharlette Dense, CRNA Patient Re-evaluated:Patient Re-evaluated prior to induction Oxygen Delivery Method: Circle system utilized Preoxygenation: Pre-oxygenation with 100% oxygen Induction Type: IV induction Ventilation: Mask ventilation without difficulty Laryngoscope Size: Miller and 3 Grade View: Grade I Tube type: Oral Tube size: 8.0 mm Number of attempts: 1 Airway Equipment and Method: Stylet Placement Confirmation: ETT inserted through vocal cords under direct vision,  positive ETCO2 and breath sounds checked- equal and bilateral Secured at: 23 cm Tube secured with: Tape Dental Injury: Teeth and Oropharynx as per pre-operative assessment

## 2017-01-29 NOTE — Transfer of Care (Signed)
Immediate Anesthesia Transfer of Care Note  Patient: Lucas Morrison  Procedure(s) Performed: XI ROBOTIC ASSISTED LOWER ANTERIOR RESECTION WITH LOOP ILEOSTOMY (N/A Abdomen) RIGID PROCTOSCOPY (Rectum)  Patient Location: PACU  Anesthesia Type:General  Level of Consciousness: awake, alert  and oriented  Airway & Oxygen Therapy: Patient Spontanous Breathing and Patient connected to face mask oxygen  Post-op Assessment: Report given to RN and Post -op Vital signs reviewed and stable  Post vital signs: Reviewed and stable  Last Vitals:  Vitals:   01/29/17 0912  BP: (!) 142/83  Pulse: 74  Resp: 18  Temp: 36.8 C  SpO2: 98%    Last Pain:  Vitals:   01/29/17 0912  TempSrc: Oral      Patients Stated Pain Goal: 4 (62/69/48 [id number])  Complications: No apparent anesthesia complications

## 2017-01-30 LAB — CBC
HCT: 39.6 % (ref 39.0–52.0)
Hemoglobin: 13.4 g/dL (ref 13.0–17.0)
MCH: 28.3 pg (ref 26.0–34.0)
MCHC: 33.8 g/dL (ref 30.0–36.0)
MCV: 83.5 fL (ref 78.0–100.0)
PLATELETS: 222 10*3/uL (ref 150–400)
RBC: 4.74 MIL/uL (ref 4.22–5.81)
RDW: 13 % (ref 11.5–15.5)
WBC: 13.1 10*3/uL — ABNORMAL HIGH (ref 4.0–10.5)

## 2017-01-30 LAB — BASIC METABOLIC PANEL
Anion gap: 6 (ref 5–15)
BUN: 17 mg/dL (ref 6–20)
CALCIUM: 8.5 mg/dL — AB (ref 8.9–10.3)
CO2: 22 mmol/L (ref 22–32)
Chloride: 101 mmol/L (ref 101–111)
Creatinine, Ser: 1.36 mg/dL — ABNORMAL HIGH (ref 0.61–1.24)
GFR calc Af Amer: >60 mL/min (ref >60)
GFR, EST NON AFRICAN AMERICAN: 60 mL/min — AB (ref >60)
GLUCOSE: 152 mg/dL — AB (ref 65–99)
Potassium: 4.4 mmol/L (ref 3.5–5.1)
Sodium: 129 mmol/L — ABNORMAL LOW (ref 135–145)

## 2017-01-30 MED ORDER — TRAMADOL HCL 50 MG PO TABS: 50.0000 mg | ORAL_TABLET | Freq: Four times a day (QID) | ORAL | Status: DC | PRN

## 2017-01-30 NOTE — Progress Notes (Signed)
1 Day Post-Op Robotic LAR with diverting ileostomy Subjective: Doing well.  Min pain.  Ileostomy functioning, no nausea, tolerating clears, ambulating in hall  Objective: Vital signs in last 24 hours: Temp:  [97.6 F (36.4 C)-98.2 F (36.8 C)] 97.8 F (36.6 C) (01/26 0525) Pulse Rate:  [57-92] 57 (01/26 0525) Resp:  [12-19] 18 (01/26 0525) BP: (104-144)/(60-84) 107/60 (01/26 0525) SpO2:  [97 %-100 %] 100 % (01/26 0525) Weight:  [104.8 kg (231 lb)] 104.8 kg (231 lb) (01/25 0923)   Intake/Output from previous day: 01/25 0701 - 01/26 0700 In: 3455 [P.O.:300; I.V.:3105; IV Piggyback:50] Out: 2805 [Urine:1800; Drains:80; Stool:850; Blood:75] Intake/Output this shift: No intake/output data recorded.   General appearance: alert and cooperative GI: soft, non-distended Ileostomy: beefy red Incision: no significant drainage  Lab Results:  Recent Labs    01/30/17 0358  WBC 13.1*  HGB 13.4  HCT 39.6  PLT 222   BMET Recent Labs    01/30/17 0358  NA 129*  K 4.4  CL 101  CO2 22  GLUCOSE 152*  BUN 17  CREATININE 1.36*  CALCIUM 8.5*   PT/INR No results for input(s): LABPROT, INR in the last 72 hours. ABG No results for input(s): PHART, HCO3 in the last 72 hours.  Invalid input(s): PCO2, PO2  MEDS, Scheduled . acetaminophen  1,000 mg Oral Q6H  . alvimopan  12 mg Oral BID  . enoxaparin (LOVENOX) injection  40 mg Subcutaneous Q24H  . ezetimibe  10 mg Oral Daily  . fenofibrate  160 mg Oral Q lunch  . mouth rinse  15 mL Mouth Rinse BID  . rosuvastatin  40 mg Oral Q supper  . saccharomyces boulardii  250 mg Oral BID    Studies/Results: No results found.  Assessment: s/p Procedure(s): XI ROBOTIC ASSISTED LOWER ANTERIOR RESECTION WITH LOOP ILEOSTOMY RIGID PROCTOSCOPY Patient Active Problem List   Diagnosis Date Noted  . Rectal cancer ([place]) 12/31/2016    Expected post op course  Plan: decrease MIV  Cont ambulation Advance to reg diet PO pain meds as  needed   LOS: 1 day     .Rosario Adie, MD [facility], [place]   01/30/2017 8:17 AM

## 2017-01-31 LAB — CBC
HCT: 37.2 % — ABNORMAL LOW (ref 39.0–52.0)
HEMOGLOBIN: 12.4 g/dL — AB (ref 13.0–17.0)
MCH: 28.3 pg (ref 26.0–34.0)
MCHC: 33.3 g/dL (ref 30.0–36.0)
MCV: 84.9 fL (ref 78.0–100.0)
PLATELETS: 208 10*3/uL (ref 150–400)
RBC: 4.38 MIL/uL (ref 4.22–5.81)
RDW: 13.2 % (ref 11.5–15.5)
WBC: 9.1 10*3/uL (ref 4.0–10.5)

## 2017-01-31 LAB — BASIC METABOLIC PANEL
ANION GAP: 6 (ref 5–15)
BUN: 17 mg/dL (ref 6–20)
CALCIUM: 8.7 mg/dL — AB (ref 8.9–10.3)
CO2: 23 mmol/L (ref 22–32)
CREATININE: 1.24 mg/dL (ref 0.61–1.24)
Chloride: 109 mmol/L (ref 101–111)
GFR calc Af Amer: >60 mL/min (ref >60)
GLUCOSE: 103 mg/dL — AB (ref 65–99)
Potassium: 4.2 mmol/L (ref 3.5–5.1)
Sodium: 138 mmol/L (ref 135–145)

## 2017-01-31 NOTE — Progress Notes (Signed)
2 Days Post-Op Robotic LAR with diverting ileostomy Subjective: Doing well.  Min pain.  Ileostomy functioning, no nausea, tolerating reg diet, ambulating in hall  Objective: Vital signs in last 24 hours: Temp:  [97.7 F (36.5 C)-98.3 F (36.8 C)] 98.3 F (36.8 C) (01/27 0510) Pulse Rate:  [66-72] 72 (01/27 0510) Resp:  [18-20] 20 (01/27 0510) BP: (110-123)/(55-64) 111/59 (01/27 0510) SpO2:  [97 %-99 %] 99 % (01/27 0510)   Intake/Output from previous day: 01/26 0701 - 01/27 0700 In: 2318.3 [P.O.:1060; I.V.:1258.3] Out: 5123 [Urine:4150; Drains:38; Stool:935] Intake/Output this shift: No intake/output data recorded.   General appearance: alert and cooperative GI: soft, non-distended Ileostomy: beefy red Incision: no significant drainage  Lab Results:  Recent Labs    01/30/17 0358 01/31/17 0422  WBC 13.1* 9.1  HGB 13.4 12.4*  HCT 39.6 37.2*  PLT 222 208   BMET Recent Labs    01/30/17 0358 01/31/17 0422  NA 129* 138  K 4.4 4.2  CL 101 109  CO2 22 23  GLUCOSE 152* 103*  BUN 17 17  CREATININE 1.36* 1.24  CALCIUM 8.5* 8.7*   PT/INR No results for input(s): LABPROT, INR in the last 72 hours. ABG No results for input(s): PHART, HCO3 in the last 72 hours.  Invalid input(s): PCO2, PO2  MEDS, Scheduled . acetaminophen  1,000 mg Oral Q6H  . enoxaparin (LOVENOX) injection  40 mg Subcutaneous Q24H  . ezetimibe  10 mg Oral Daily  . fenofibrate  160 mg Oral Q lunch  . mouth rinse  15 mL Mouth Rinse BID  . rosuvastatin  40 mg Oral Q supper  . saccharomyces boulardii  250 mg Oral BID    Studies/Results: No results found.  Assessment: s/p Procedure(s): XI ROBOTIC ASSISTED LOWER ANTERIOR RESECTION WITH LOOP ILEOSTOMY RIGID PROCTOSCOPY Patient Active Problem List   Diagnosis Date Noted  . Rectal cancer ([place]) 12/31/2016    Expected post op course  Plan: SL MIV  Cont ambulation Cont reg diet PO pain meds as needed   LOS: 2 days     .Rosario Adie, [place] [facility], [place]   01/31/2017 7:54 AM

## 2017-01-31 NOTE — Progress Notes (Signed)
Pt demonstrated emptying his ileostomy independently with me observing. Pt is very eager to learn about taking care of his ostomy.  Rosie Fate

## 2017-02-01 ENCOUNTER — Ambulatory Visit: Payer: BLUE CROSS/BLUE SHIELD

## 2017-02-01 ENCOUNTER — Encounter (HOSPITAL_COMMUNITY): Payer: Self-pay | Admitting: General Surgery

## 2017-02-01 LAB — BASIC METABOLIC PANEL
Anion gap: 7 (ref 5–15)
BUN: 16 mg/dL (ref 6–20)
CALCIUM: 9.5 mg/dL (ref 8.9–10.3)
CO2: 25 mmol/L (ref 22–32)
Chloride: 107 mmol/L (ref 101–111)
Creatinine, Ser: 1.22 mg/dL (ref 0.61–1.24)
GFR calc Af Amer: >60 mL/min (ref >60)
GLUCOSE: 94 mg/dL (ref 65–99)
POTASSIUM: 4 mmol/L (ref 3.5–5.1)
Sodium: 139 mmol/L (ref 135–145)

## 2017-02-01 LAB — CBC
HEMATOCRIT: 40.7 % (ref 39.0–52.0)
Hemoglobin: 13.9 g/dL (ref 13.0–17.0)
MCH: 28.8 pg (ref 26.0–34.0)
MCHC: 34.2 g/dL (ref 30.0–36.0)
MCV: 84.4 fL (ref 78.0–100.0)
Platelets: 252 10*3/uL (ref 150–400)
RBC: 4.82 MIL/uL (ref 4.22–5.81)
RDW: 13.2 % (ref 11.5–15.5)
WBC: 9.4 10*3/uL (ref 4.0–10.5)

## 2017-02-01 MED ORDER — LOPERAMIDE HCL 2 MG PO CAPS
2.0000 mg | ORAL_CAPSULE | Freq: Three times a day (TID) | ORAL | Status: DC
  Administered 2017-02-01 – 2017-02-02 (×4): 2 mg via ORAL
  Filled 2017-02-01 (×4): qty 1

## 2017-02-01 NOTE — Progress Notes (Signed)
Discharge planning, spoke with patient and spouse at beside. Chose [facility] for [facility] services, RN for ostomy care and teaching. Contacted [facility] for referral. Anticipating d/c in am.  [phone]

## 2017-02-01 NOTE — Discharge Instructions (Addendum)
ABDOMINAL SURGERY: POST OP INSTRUCTIONS ° °1. DIET: Follow a light bland diet the first 24 hours after arrival home, such as soup, liquids, crackers, etc.  Be sure to include lots of fluids daily.  Avoid fast food or heavy meals as your are more likely to get nauseated.  Do not eat any uncooked fruits or vegetables for the next 2 weeks as your colon heals. °2. Take your usually prescribed home medications unless otherwise directed. °3. PAIN CONTROL: °a. Pain is best controlled by a usual combination of three different methods TOGETHER: °i. Ice/Heat °ii. Over the counter pain medication °iii. Prescription pain medication °b. Most patients will experience some swelling and bruising around the incisions.  Ice packs or heating pads (30-60 minutes up to 6 times a day) will help. Use ice for the first few days to help decrease swelling and bruising, then switch to heat to help relax tight/sore spots and speed recovery.  Some people prefer to use ice alone, heat alone, alternating between ice & heat.  Experiment to what works for you.  Swelling and bruising can take several weeks to resolve.   °c. It is helpful to take an over-the-counter pain medication regularly for the first few weeks.  Choose one of the following that works best for you: °i. Naproxen (Aleve, etc)  Two 220mg tabs twice a day °ii. Ibuprofen (Advil, etc) Three 200mg tabs four times a day (every meal & bedtime) °iii. Acetaminophen (Tylenol, etc) 500-650mg four times a day (every meal & bedtime) °d. A  prescription for pain medication (such as oxycodone, hydrocodone, etc) should be given to you upon discharge.  Take your pain medication as prescribed.  °i. If you are having problems/concerns with the prescription medicine (does not control pain, nausea, vomiting, rash, itching, etc), please call us [phone] to see if we need to switch you to a different pain medicine that will work better for you and/or control your side effect better. °ii. If you  need a refill on your pain medication, please contact your pharmacy.  They will contact our office to request authorization. Prescriptions will not be filled after 5 pm or on week-ends. °4. Avoid getting constipated.  Between the surgery and the pain medications, it is common to experience some constipation.  Increasing fluid intake and taking a fiber supplement (such as Metamucil, Citrucel, FiberCon, MiraLax, etc) 1-2 times a day regularly will usually help prevent this problem from occurring.  A mild laxative (prune juice, Milk of Magnesia, MiraLax, etc) should be taken according to package directions if there are no bowel movements after 48 hours.   °5. Watch out for diarrhea.  If you have many loose bowel movements, simplify your diet to bland foods & liquids for a few days.  Stop any stool softeners and decrease your fiber supplement.  Switching to mild anti-diarrheal medications (Kayopectate, Pepto Bismol) can help.  If this worsens or does not improve, please call us. °6. Wash / shower every day.  You may shower over the incision / wound.  Avoid baths until the skin is fully healed.  Continue to shower over incision(s) after the dressing is off. °7. Remove your waterproof bandages 5 days after surgery.  You may leave the incision open to air.  You may replace a dressing/Band-Aid to cover the incision for comfort if you wish. °8. ACTIVITIES as tolerated:   °a. You may resume regular (light) daily activities beginning the next day--such as daily self-care, walking, climbing stairs--gradually increasing activities as   tolerated.  If you can walk 30 minutes without difficulty, it is safe to try more intense activity such as jogging, treadmill, bicycling, low-impact aerobics, swimming, etc. b. Save the most intensive and strenuous activity for last such as sit-ups, heavy lifting, contact sports, etc  Refrain from any heavy lifting or straining until you are off narcotics for pain control.   c. DO NOT PUSH THROUGH  PAIN.  Let pain be your guide: If it hurts to do something, don't do it.  Pain is your body warning you to avoid that activity for another week until the pain goes down. d. You may drive when you are no longer taking prescription pain medication, you can comfortably wear a seatbelt, and you can safely maneuver your car and apply brakes. e. Dennis Bast may have sexual intercourse when it is comfortable.  9. FOLLOW UP in our office a. Please call [facility] at [phone] to set up an appointment to see your surgeon in the office for a follow-up appointment approximately 1-2 weeks after your surgery. b. Make sure that you call for this appointment the day you arrive home to insure a convenient appointment time. 10. IF YOU HAVE DISABILITY OR FAMILY LEAVE FORMS, BRING THEM TO THE OFFICE FOR PROCESSING.  DO NOT GIVE THEM TO YOUR DOCTOR.   WHEN TO CALL us [phone]: 1. Poor pain control 2. Reactions / problems with new medications (rash/itching, nausea, etc)  3. Fever over 101.5 F (38.5 C) 4. Inability to urinate 5. Nausea and/or vomiting 6. Worsening swelling or bruising 7. Continued bleeding from incision. 8. Increased pain, redness, or drainage from the incision  The clinic staff is available to answer your questions during regular business hours (8:30am-5pm).  Please dont hesitate to call and ask to speak to one of our nurses for clinical concerns.   A surgeon from [facility] is always on call at the hospitals   If you have a medical emergency, go to the nearest emergency room or call 911.    [facility], [place], [place], [place], [place]  [phone] ? MAIN: [phone] ? TOLL FREE: [phone] ? FAX [phone] [email or web address]  For the first 2 weeks after surgery.  Measure your stool output daily.  If >1L, drink extra fluids.  If >134ml, take an extra imodium.  If this continues for more than 2 days or your urine becomes very  dark, please call the office.   Loop Ileostomy, Care After Refer to this sheet in the next few weeks. These instructions provide you with information about caring for yourself after your procedure. Your health care provider may also give you more specific instructions. Your treatment has been planned according to current medical practices, but problems sometimes occur. Call your health care provider if you have any problems or questions after your procedure. What can I expect after the procedure? After the procedure, it is common to have:  A small amount of blood or clear fluid leaking from your stoma.  Pain and discomfort in your abdomen, especially around your stoma.  Irregular bowel movements for several days.  Loose stool.  Follow these instructions at home: Medicines  Take over-the-counter and prescription medicines only as told by your health care provider.  If you were prescribed an antibiotic medicine, take it as told by your health care provider. Do not stop taking the antibiotic even if you start to feel better. Stoma Care  Keep your stoma and the surrounding skin  clean and dry.  Follow your health care providers instructions about how to take care of your stoma. It is important to: ? Wash your hands with soap and water before you change your bandage (dressing). If soap and water are not available, use hand sanitizer. ? Change your dressing as told by your health care provider. ? Leave stitches (sutures), skin glue, or adhesive strips in place. In some cases, these skin closures may need to be in place for 2 weeks or longer. If adhesive strip edges start to loosen and curl up, you may trim the loose edges. Do not remove adhesive strips entirely unless your health care provider tells you to remove them.  Check your stoma area every day for signs of infection. Check for: ? More redness, swelling, or pain. ? More fluid or blood. ? Warmth. ? Pus or a bad smell.  Follow  your health care providers instructions about changing and cleaning your ostomy pouch.  Keep supplies to care for your stoma and ostomy pouch with you at all times. Eating and drinking    Follow instructions from your health care provider about eating or drinking restrictions.  Pay attention to which foods and drinks cause problems with digestion, such as gas, constipation, or diarrhea.  Avoid spicy foods and caffeine while your stoma heals.  Eat meals and snacks at regular intervals.  Drink enough fluid to keep your urine clear or pale yellow. Activity  Return to your normal activities as told by your health care provider. Ask your health care provider what activities are safe for you.  Rest as much as possible while your stoma heals.  Avoid intense physical activity for as long as you are told by your health care provider.  Do not lift anything that is heavier than 10 lb (4.5 kg) for 6 weeks or as long as told by your health care provider. Driving  Do not drive for 24 hours if you received a sedative.  Do not drive or operate heavy machinery while taking prescription pain medicine. General instructions  Wear compression stockings as told by your health care provider. These stockings help to prevent blood clots and reduce swelling in your legs.  Do not take baths, swim, or use a hot tub until your health care provider approves.  Do not use tobacco products, including cigarettes, chewing tobacco, or e-cigarettes. If you need help quitting, ask your health care provider.  (Women) If you plan to become pregnant or if you take birth control pills, discuss this with your health care provider.  Keep all follow-up visits as told by your health care provider. This is important. Contact a health care provider if:  You have more redness, swelling, or pain around your stoma.  You have more fluid or blood coming from your stoma.  Your stoma feels warm to the touch.  You have  pus or a bad smell coming from your stoma.  You have a fever.  You have loose stools that do not get firmer after several weeks.  You have bowel movements more or less often than is expected by your health care provider.  You feel nauseous.  You vomit.  You have abdominal pain, bloating, pressure, or cramping.  You have problems with sexual activity.  You have an unusual lack of energy (fatigue).  You are unusually thirsty or you always have a dry mouth. Get help right away if:  You feel dizzy or lightheaded.  You have abdominal pain or cramps that do  not go away with medicine or get worse.  Your stoma suddenly changes size or color.  You have shortness of breath.  You have bleeding from your stoma that does not stop.  You vomit more than once.  You faint.  You have internal tissue coming out of your stoma (prolapse).  You have an irregular heartbeat.  You have chest pain. This information is not intended to replace advice given to you by your health care provider. Make sure you discuss any questions you have with your health care provider.

## 2017-02-01 NOTE — Consult Note (Signed)
[facility] Nurse ostomy consult note: POD 3 Stoma type/location: RLQ ileostomy (loop) with red rubber catheter bridge. (Dr. Marcello Moores) Stomal assessment/size: 1 and 3/4 inch red, edematous, raised Peristomal assessment: Medical adhesive related skin injury (MARSI) at 1-5 o'clock and from 7-10 o'clock  (the latter is more severe)  Treatment options for stomal/peristomal skin: skin barrier ring and convex pouch.  Thin film transparent dressing to areas of MARSI Output: brown effluent.  Ostomy pouching: 1pc.convex pouch with skin barrier ring.  Education provided: Extended session for teaching. GI A&P, stoma characteristics, pouch characteristics, pouch emptying, Lock and Roll closure, purpose of skin barrier ring.  Pouch removal, pouch preparation and pouch application demonstrated. Dietary consideration in that patient should chew foods thoroughly and drink plenty of fluids to avoid blockage.  Change pouch in the am for greater success.  HHRN needed for monitoring size change and adjusting pattern, dressing areas of MARSI until healed, assisting patient to master ostomy pouch changes standing up in bathroom. Ostomy rod will need to be removed at Dr. Marcello Moores' direction. Enrolled patient in [place] program: Yes  [facility] nursing team will follow while in house, and will remain available to this patient, the nursing, surgical and medical teams.   Thanks, Maudie Flakes, MSN, RN, [place], Arther Abbott  Pager# [phone]

## 2017-02-01 NOTE — Anesthesia Postprocedure Evaluation (Signed)
Anesthesia Post Note  Patient: Lucas Morrison  Procedure(s) Performed: XI ROBOTIC ASSISTED LOWER ANTERIOR RESECTION WITH LOOP ILEOSTOMY (N/A Abdomen) RIGID PROCTOSCOPY (Rectum)     Patient location during evaluation: PACU Anesthesia Type: General Level of consciousness: awake Pain management: pain level controlled Vital Signs Assessment: post-procedure vital signs reviewed and stable Respiratory status: spontaneous breathing Cardiovascular status: stable Postop Assessment: no apparent nausea or vomiting Anesthetic complications: no    Last Vitals:  Vitals:   01/31/17 2114 02/01/17 0559  BP: 124/80 118/86  Pulse: (!) 55 (!) 51  Resp: 16 16  Temp: 36.9 C 36.4 C  SpO2: 99% 98%    Last Pain:  Vitals:   02/01/17 0559  TempSrc: Oral  PainSc:    Pain Goal: Patients Stated Pain Goal: 4 (01/31/17 0800)               Tyson Parkison JR,JOHN Mateo Flow

## 2017-02-01 NOTE — Progress Notes (Signed)
3 Days Post-Op Robotic LAR with diverting ileostomy Subjective: Doing well.  Min pain.  Ileostomy functioning, no nausea, tolerating reg diet, ambulating in hall  Objective: Vital signs in last 24 hours: Temp:  [97.6 F (36.4 C)-98.4 F (36.9 C)] 97.6 F (36.4 C) (01/28 0559) Pulse Rate:  [51-62] 51 (01/28 0559) Resp:  [16-18] 16 (01/28 0559) BP: (118-124)/(69-86) 118/86 (01/28 0559) SpO2:  [98 %-100 %] 98 % (01/28 0559)   Intake/Output from previous day: 01/27 0701 - 01/28 0700 In: 720 [P.O.:720] Out: 3903 [Urine:2675; Drains:3; Stool:1225] Intake/Output this shift: No intake/output data recorded.   General appearance: alert and cooperative GI: soft, non-distended Ileostomy: beefy red Incision: no significant drainage  Lab Results:  Recent Labs    01/31/17 0422 02/01/17 0400  WBC 9.1 9.4  HGB 12.4* 13.9  HCT 37.2* 40.7  PLT 208 252   BMET Recent Labs    01/31/17 0422 02/01/17 0400  NA 138 139  K 4.2 4.0  CL 109 107  CO2 23 25  GLUCOSE 103* 94  BUN 17 16  CREATININE 1.24 1.22  CALCIUM 8.7* 9.5   PT/INR No results for input(s): LABPROT, INR in the last 72 hours. ABG No results for input(s): PHART, HCO3 in the last 72 hours.  Invalid input(s): PCO2, PO2  MEDS, Scheduled . acetaminophen  1,000 mg Oral Q6H  . enoxaparin (LOVENOX) injection  40 mg Subcutaneous Q24H  . ezetimibe  10 mg Oral Daily  . fenofibrate  160 mg Oral Q lunch  . loperamide  2 mg Oral TID AC & HS  . mouth rinse  15 mL Mouth Rinse BID  . rosuvastatin  40 mg Oral Q supper  . saccharomyces boulardii  250 mg Oral BID    Studies/Results: No results found.  Assessment: s/p Procedure(s): XI ROBOTIC ASSISTED LOWER ANTERIOR RESECTION WITH LOOP ILEOSTOMY RIGID PROCTOSCOPY Patient Active Problem List   Diagnosis Date Noted  . Rectal cancer ([place]) 12/31/2016    Expected post op course  Plan: SL MIV  D/c JP Cont ambulation Cont reg diet PO pain meds as needed Will add  imodium to decrease ostomy output some Most likely will be ready for d/c tom   LOS: 3 days     .Rosario Adie, [facility], [place]   02/01/2017 7:42 AM

## 2017-02-02 ENCOUNTER — Ambulatory Visit: Payer: BLUE CROSS/BLUE SHIELD

## 2017-02-02 LAB — BASIC METABOLIC PANEL
Anion gap: 7 (ref 5–15)
BUN: 13 mg/dL (ref 6–20)
CHLORIDE: 104 mmol/L (ref 101–111)
CO2: 26 mmol/L (ref 22–32)
CREATININE: 1.1 mg/dL (ref 0.61–1.24)
Calcium: 9.2 mg/dL (ref 8.9–10.3)
GFR calc non Af Amer: >60 mL/min (ref >60)
Glucose, Bld: 98 mg/dL (ref 65–99)
POTASSIUM: 3.8 mmol/L (ref 3.5–5.1)
Sodium: 137 mmol/L (ref 135–145)

## 2017-02-02 MED ORDER — LOPERAMIDE HCL 2 MG PO CAPS: 2.0000 mg | ORAL_CAPSULE | Freq: Two times a day (BID) | ORAL | 0 refills | Status: DC

## 2017-02-02 NOTE — Consult Note (Signed)
[facility] Nurse ostomy follow up Stoma type/location: RLQ ileostomy (loop),  Dr,. Thomas requests rod to be removed today prior to discharge. Rod cut with iris scissors and gently removed without incident or discomfort expressed by patient.  Repouched. Ready for discharge. Stomal assessment/size: 1 and 3/4 inch round, raised, red, edematous Peristomal assessment: intact in the immediate peristomal plan, with MARSI blisters on either side. See note from yesterday. Both are redressed today with thin film transparent dressings. Treatment options for stomal/peristomal skin: See above Output: brown effluent Ostomy pouching: 1pc convex pouch with skin barrier ring.  Education provided: Patient taught about rod removal, wife present for teaching. Wife observed pouch change and patient talking through all steps.  Is independent in emptying. HHRN to call tomorrow to arrange admission visit, will change pouch on either Thursday or Friday. Supplies in hand, patient has my contact information for questions. Enrolled patient in [place] Start Discharge program: Yes   [facility] nursing team will follow in house and will remain available to this patient, the nursing, surgical and medical teams.   Thanks, Maudie Flakes, MSN, RN, [place], Arther Abbott  Pager# [phone]

## 2017-02-02 NOTE — Progress Notes (Signed)
Pt alert, oriented, tolerating his diet. No complaints of pain.  Rod was removed by [facility] nurse prior to d/c.  D/C instructions were given and all questions answered. Pt was d/cd home with spouse.

## 2017-02-02 NOTE — Discharge Summary (Signed)
Physician Discharge Summary  Patient ID: Lucas Morrison MRN: [id number] DOB/AGE: November 07, 1970 47 y.o.  Admit date: 01/29/2017 Discharge date: 02/02/2017  Admission Diagnoses: rectal cancer  Discharge Diagnoses:  Active Problems:   Rectal cancer [facility])   Discharged Condition: good  Hospital Course: Patient admitted after surgery.  His diet was advanced as tolerated.  By POD 4 he was ambulating well with no pain.  His ostomy output was < 1L per day and he was well versed in his ostomy care.  Consults: None  Significant Diagnostic Studies: labs: cbc, bmet  Treatments: IV hydration, analgesia: acetaminophen and surgery: robotic LAR with diverting ileostomy  Discharge Exam: Blood pressure 109/72, pulse (!) 53, temperature 98.3 F (36.8 C), temperature source Oral, resp. rate 18, height 6\' 3"  (1.905 m), weight 104.8 kg (231 lb), SpO2 97 %. General appearance: alert and cooperative GI: normal findings: soft, non-tender Incision/Wound: clean, dry Ostomy: pink, viable  Disposition: 01-Home or Self Care   Allergies as of 02/02/2017   No Known Allergies     Medication List    TAKE these medications   ezetimibe 10 MG tablet Commonly known as:  ZETIA Take 10 mg by mouth daily.   fenofibrate 160 MG tablet Take 160 mg by mouth daily with lunch.   Fish Oil 1000 MG Caps Take 1,000 mg by mouth daily.   guaiFENesin 600 MG 12 hr tablet Commonly known as:  MUCINEX Take 600 mg by mouth daily as needed for cough.   loperamide 2 MG capsule Commonly known as:  IMODIUM Take 1 capsule (2 mg total) by mouth 2 (two) times daily.   multivitamin with minerals Tabs tablet Take 1 tablet by mouth daily.   naproxen sodium 220 MG tablet Commonly known as:  ALEVE Take 220-440 mg by mouth daily as needed (pain).   rosuvastatin 40 MG tablet Commonly known as:  CRESTOR Take 40 mg by mouth daily with supper.   sildenafil 100 MG tablet Commonly known as:  VIAGRA Take 100 mg by mouth as needed  for erectile dysfunction.      Follow-up Information    Leighton Ruff, MD. Schedule an appointment as soon as possible for a visit in 2 week(s).   Specialty:  General Surgery Contact information: [address] [place] [address] [phone]        [facility] Follow up.   Specialty:  [place] Why:  nurse of ostomy care and teaching Contact information: [address] [place] [address] [phone]           Signed: Rosario Adie 6/83/4196, 2:22 AM

## 2017-02-03 ENCOUNTER — Ambulatory Visit: Payer: BLUE CROSS/BLUE SHIELD

## 2017-02-04 ENCOUNTER — Ambulatory Visit: Payer: BLUE CROSS/BLUE SHIELD

## 2017-02-05 ENCOUNTER — Ambulatory Visit: Payer: BLUE CROSS/BLUE SHIELD

## 2017-02-08 ENCOUNTER — Ambulatory Visit: Payer: BLUE CROSS/BLUE SHIELD

## 2017-02-09 ENCOUNTER — Ambulatory Visit: Payer: BLUE CROSS/BLUE SHIELD

## 2017-02-10 ENCOUNTER — Ambulatory Visit: Payer: BLUE CROSS/BLUE SHIELD

## 2017-02-11 ENCOUNTER — Ambulatory Visit: Payer: BLUE CROSS/BLUE SHIELD

## 2017-02-12 ENCOUNTER — Ambulatory Visit: Payer: BLUE CROSS/BLUE SHIELD

## 2017-02-15 ENCOUNTER — Ambulatory Visit: Payer: BLUE CROSS/BLUE SHIELD

## 2017-02-16 ENCOUNTER — Ambulatory Visit: Payer: BLUE CROSS/BLUE SHIELD

## 2017-02-17 ENCOUNTER — Ambulatory Visit: Payer: BLUE CROSS/BLUE SHIELD

## 2017-02-18 ENCOUNTER — Ambulatory Visit: Payer: BLUE CROSS/BLUE SHIELD

## 2017-02-19 ENCOUNTER — Ambulatory Visit: Payer: BLUE CROSS/BLUE SHIELD

## 2017-02-22 ENCOUNTER — Inpatient Hospital Stay: Payer: BLUE CROSS/BLUE SHIELD

## 2017-02-22 ENCOUNTER — Encounter: Payer: Self-pay | Admitting: Genetic Counselor

## 2017-02-22 ENCOUNTER — Inpatient Hospital Stay: Payer: BLUE CROSS/BLUE SHIELD | Attending: Genetic Counselor | Admitting: Genetic Counselor

## 2017-02-22 ENCOUNTER — Ambulatory Visit: Payer: BLUE CROSS/BLUE SHIELD

## 2017-02-22 DIAGNOSIS — Z800 Family history of malignant neoplasm of digestive organs: Secondary | ICD-10-CM | POA: Diagnosis not present

## 2017-02-22 DIAGNOSIS — C20 Malignant neoplasm of rectum: Secondary | ICD-10-CM

## 2017-02-22 DIAGNOSIS — Z8052 Family history of malignant neoplasm of bladder: Secondary | ICD-10-CM

## 2017-02-22 NOTE — Progress Notes (Signed)
REFERRING PROVIDER: Hayden Pedro, PA-C [place], [place] [address]  PRIMARY PROVIDER:  Linwood Dibbles, PA-C  PRIMARY REASON FOR VISIT:  1. Rectal cancer ([place])   2. Family history of colon cancer   3. Family history of bladder cancer      HISTORY OF PRESENT ILLNESS:   Lucas Morrison, a 47 y.o. male, was seen for a [facility] cancer genetics consultation at the request of Worthy Flank due to a personal and family history of cancer.  Lucas Morrison presents to clinic today to discuss the possibility of a hereditary predisposition to cancer, genetic testing, and to further clarify his future cancer risks, as well as potential cancer risks for family members.   In December 2018, at the age of 34, Lucas Morrison was diagnosed with cancer of the rectum. This was treated with surgery.  He will not need chemotherapy or radiation.  MSI was stable and IHC was negative.     CANCER HISTORY:   No history exists.      Past Medical History:  Diagnosis Date  . Cancer ([place]) 12/21/2016   Rectal  . Erectile dysfunction   . Family history of bladder cancer   . Family history of colon cancer   . Hyperlipidemia   . Osteoarthritis   . Rectal adenocarcinoma ([place])   . Thyroid disease    left parathyroidectomy    Past Surgical History:  Procedure Laterality Date  . APPENDECTOMY    . EUS N/A 12/31/2016   Procedure: LOWER ENDOSCOPIC ULTRASOUND (EUS);  Surgeon: [name], MD;  Location: [facility];  Service: Endoscopy;  Laterality: N/A;  . PARATHYROIDECTOMY Left   . PROCTOSCOPY  01/29/2017   Procedure: RIGID PROCTOSCOPY;  Surgeon: Leighton Ruff, MD;  Location: [facility];  Service: General;;  . XI ROBOTIC ASSISTED LOWER ANTERIOR RESECTION N/A 01/29/2017   Procedure: XI ROBOTIC ASSISTED LOWER ANTERIOR RESECTION WITH LOOP ILEOSTOMY;  Surgeon: Leighton Ruff, MD;  Location: [facility];  Service: General;  Laterality: N/A;  ERAS PATHWAY    Social History   Socioeconomic History  .  Marital status: Married    Spouse name: Not on file  . Number of children: Not on file  . Years of education: Not on file  . Highest education level: Not on file  Social Needs  . Financial resource strain: Not on file  . Food insecurity - worry: Not on file  . Food insecurity - inability: Not on file  . Transportation needs - medical: Not on file  . Transportation needs - non-medical: Not on file  Occupational History  . Not on file  Tobacco Use  . Smoking status: Current Some Day Smoker    Types: Cigarettes  . Smokeless tobacco: Never Used  Substance and Sexual Activity  . Alcohol use: Yes  . Drug use: No  . Sexual activity: Not on file  Other Topics Concern  . Not on file  Social History Narrative  . Not on file     FAMILY HISTORY:  We obtained a detailed, 4-generation family history.  Significant diagnoses are listed below: Family History  Problem Relation Age of Onset  . Colon cancer Mother        36's at dx  . Lung cancer Father   . Heart disease Father 33  . Throat cancer Father   . Bladder Cancer Brother 56  . Heart disease Cousin 26       maternal cousin, died of heart attack  The patient has two sons who are cancer free.  He has two brothers, one who was diagnosed with bladder cancer at 8.  Both parents are deceased.  The patient's mother was diagnosed with colon cancer in her mid 67's and died at 49.  She had one sister who died of non cancer related issues at 100.  The maternal grandparents are deceased.  The grandfather died in WWII and the grandmother died of unknown causes.  The patient's father had both lung and throat cancer.  He had four brothers and a sister.  One brother is in his 28's and lives in [place], the other siblings are deceased from non cancer related issues.  The grandparents died before the patient was born and he does not know what caused their death.  Lucas Morrison is unaware of previous family history of genetic testing for hereditary  cancer risks. Patient's maternal ancestors are of [place] descent, and paternal ancestors are of [place] descent. There is no reported Ashkenazi Jewish ancestry. There is no known consanguinity.  GENETIC COUNSELING ASSESSMENT: Lucas Morrison is a 47 y.o. male with a personal history of rectal cancer and family history of bladder and colon cancer which is somewhat suggestive of a hereditary cancer syndrome and predisposition to cancer. We, therefore, discussed and recommended the following at today's visit.   DISCUSSION: We discussed that about 5-6% of colon cancer is hereditary with most cases due to Lynch syndrome.  The patient' had tumor testing for Lynch syndrome which was negative, greatly reducing the chance that his cancer is Lynch related.  However, tumor testing is not 100% and with his young age of onset we should still consider this condition.  Additionally, we discussed other hereditary colon cancer syndromes.  We reviewed the characteristics, features and inheritance patterns of hereditary cancer syndromes. We also discussed genetic testing, including the appropriate family members to test, the process of testing, insurance coverage and turn-around-time for results. We discussed the implications of a negative, positive and/or variant of uncertain significant result. We recommended Lucas Morrison pursue genetic testing for the common hereditary cancer gene panel. The Hereditary Gene Panel offered by [facility] includes sequencing and/or deletion duplication testing of the following 47 genes: APC, ATM, AXIN2, BARD1, BMPR1A, BRCA1, BRCA2, BRIP1, CDH1, CDK4, CDKN2A (p14ARF), CDKN2A (p16INK4a), CHEK2, CTNNA1, DICER1, EPCAM (Deletion/duplication testing only), GREM1 (promoter region deletion/duplication testing only), KIT, MEN1, MLH1, MSH2, MSH3, MSH6, MUTYH, NBN, NF1, NHTL1, PALB2, PDGFRA, PMS2, POLD1, POLE, PTEN, RAD50, RAD51C, RAD51D, SDHB, SDHC, SDHD, SMAD4, SMARCA4. STK11, TP53, TSC1, TSC2, and VHL.  The  following genes were evaluated for sequence changes only: SDHA and HOXB13 c.251G>A variant only.   Based on Lucas Morrison personal and family history of cancer, he meets medical criteria for genetic testing. Despite that he meets criteria, he may still have an out of pocket cost. We discussed that if his out of pocket cost for testing is over $100, the laboratory will call and confirm whether he wants to proceed with testing.  If the out of pocket cost of testing is less than $100 he will be billed by the genetic testing laboratory.   PLAN: After considering the risks, benefits, and limitations, Lucas Morrison  provided informed consent to pursue genetic testing and the blood sample was sent to [facility] for analysis of the common hereditary cancer panel. Results should be available within approximately 2-3 weeks' time, at which point they will be disclosed by telephone to Lucas Morrison, as will any additional recommendations warranted by  these results. Lucas Morrison will receive a summary of his genetic counseling visit and a copy of his results once available. This information will also be available in Epic. We encouraged Lucas Morrison to remain in contact with cancer genetics annually so that we can continuously update the family history and inform him of any changes in cancer genetics and testing that may be of benefit for his family. Lucas Morrison questions were answered to his satisfaction today. Our contact information was provided should additional questions or concerns arise.  Lastly, we encouraged Lucas Morrison to remain in contact with cancer genetics annually so that we can continuously update the family history and inform him of any changes in cancer genetics and testing that may be of benefit for this family.   Mr.  Morrison questions were answered to his satisfaction today. Our contact information was provided should additional questions or concerns arise. Thank you for the referral and allowing [place] to share in the  care of your patient.   Karen P. Florene Glen, [place], [facility] Certified Genetic Counselor [email or web address] phone: [phone]  The patient was seen for a total of 40 minutes in face-to-face genetic counseling.  This patient was discussed with Drs. Magrinat, Lindi Adie and/or Burr Medico who agrees with the above.    _______________________________________________________________________ For Office Staff:  Number of people involved in session: 2 Was an Intern/ student involved with case: yes: Donnetta Hail

## 2017-02-23 ENCOUNTER — Ambulatory Visit: Payer: BLUE CROSS/BLUE SHIELD

## 2017-02-24 ENCOUNTER — Ambulatory Visit: Payer: BLUE CROSS/BLUE SHIELD

## 2017-03-09 ENCOUNTER — Encounter: Payer: Self-pay | Admitting: Genetic Counselor

## 2017-03-09 ENCOUNTER — Ambulatory Visit: Payer: Self-pay | Admitting: General Surgery

## 2017-03-09 ENCOUNTER — Telehealth: Payer: Self-pay | Admitting: Genetic Counselor

## 2017-03-09 ENCOUNTER — Ambulatory Visit: Payer: Self-pay | Admitting: Genetic Counselor

## 2017-03-09 DIAGNOSIS — C20 Malignant neoplasm of rectum: Secondary | ICD-10-CM

## 2017-03-09 DIAGNOSIS — Z8052 Family history of malignant neoplasm of bladder: Secondary | ICD-10-CM

## 2017-03-09 DIAGNOSIS — Z1379 Encounter for other screening for genetic and chromosomal anomalies: Secondary | ICD-10-CM | POA: Insufficient documentation

## 2017-03-09 DIAGNOSIS — Z800 Family history of malignant neoplasm of digestive organs: Secondary | ICD-10-CM

## 2017-03-09 MED ORDER — BUPIVACAINE LIPOSOME 1.3 % IJ SUSP: 20.0000 mL | INTRAMUSCULAR | Status: AC

## 2017-03-09 NOTE — Telephone Encounter (Signed)
Revealed negative genetic testing.  Discussed that we do not know why he has rectal cancer or why there is cancer in the family. It could be due to a different gene that we are not testing, or maybe our current technology may not be able to pick something up.  It will be important for him to keep in contact with genetics to keep up with whether additional testing may be needed.  Discussed that there were two VUS identified.  This is still considered a normal result. We will recontact the family when the VUS are reclassified.  Reminded patient that his sons need to get screening starting 10 years younger than his diagnosis.

## 2017-03-09 NOTE — Progress Notes (Signed)
HPI: Mr. Gibler was previously seen in the [facility] clinic due to a personal and family history of cancer and concerns regarding a hereditary predisposition to cancer. Please refer to our prior cancer genetics clinic note for more information regarding Mr. Knecht medical, social and family histories, and our assessment and recommendations, at the time. Mr. Stankowski recent genetic test results were disclosed to him, as were recommendations warranted by these results. These results and recommendations are discussed in more detail below.  CANCER HISTORY:    Rectal cancer ([place])   12/31/2016 Initial Diagnosis    Rectal cancer ([facility])      03/02/2017 Genetic Testing    APC c.-30529_-30582delinsGG (Non-coding) and POLD1 c.497G>A (p.Arg166Gln) VUS identified on the common hereditary cancer panel. The Hereditary Gene Panel offered by [facility] includes sequencing and/or deletion duplication testing of the following 47 genes: APC, ATM, AXIN2, BARD1, BMPR1A, BRCA1, BRCA2, BRIP1, CDH1, CDK4, CDKN2A (p14ARF), CDKN2A (p16INK4a), CHEK2, CTNNA1, DICER1, EPCAM (Deletion/duplication testing only), GREM1 (promoter region deletion/duplication testing only), KIT, MEN1, MLH1, MSH2, MSH3, MSH6, MUTYH, NBN, NF1, NHTL1, PALB2, PDGFRA, PMS2, POLD1, POLE, PTEN, RAD50, RAD51C, RAD51D, SDHB, SDHC, SDHD, SMAD4, SMARCA4. STK11, TP53, TSC1, TSC2, and VHL.  The following genes were evaluated for sequence changes only: SDHA and HOXB13 c.251G>A variant only. The report date is March 02, 2017.        FAMILY HISTORY:  We obtained a detailed, 4-generation family history.  Significant diagnoses are listed below: Family History  Problem Relation Age of Onset  . Colon cancer Mother        48's at dx  . Lung cancer Father   . Heart disease Father 33  . Throat cancer Father   . Bladder Cancer Brother 9  . Heart disease Cousin 25       maternal cousin, died of heart attack    The patient has two sons who are cancer  free.  He has two brothers, one who was diagnosed with bladder cancer at 12.  Both parents are deceased.  The patient's mother was diagnosed with colon cancer in her mid 2's and died at 19.  She had one sister who died of non cancer related issues at 52.  The maternal grandparents are deceased.  The grandfather died in WWII and the grandmother died of unknown causes.  The patient's father had both lung and throat cancer.  He had four brothers and a sister.  One brother is in his 85's and lives in [place], the other siblings are deceased from non cancer related issues.  The grandparents died before the patient was born and he does not know what caused their death.  Mr. Cuffe is unaware of previous family history of genetic testing for hereditary cancer risks. Patient's maternal ancestors are of [place] descent, and paternal ancestors are of [place] descent. There is no reported Ashkenazi Jewish ancestry. There is no known consanguinity.  GENETIC TEST RESULTS: Genetic testing reported out on March 02, 2017 through the common hereditary cancer panel found no deleterious mutations.  The Hereditary Gene Panel offered by [facility] includes sequencing and/or deletion duplication testing of the following 47 genes: APC, ATM, AXIN2, BARD1, BMPR1A, BRCA1, BRCA2, BRIP1, CDH1, CDK4, CDKN2A (p14ARF), CDKN2A (p16INK4a), CHEK2, CTNNA1, DICER1, EPCAM (Deletion/duplication testing only), GREM1 (promoter region deletion/duplication testing only), KIT, MEN1, MLH1, MSH2, MSH3, MSH6, MUTYH, NBN, NF1, NHTL1, PALB2, PDGFRA, PMS2, POLD1, POLE, PTEN, RAD50, RAD51C, RAD51D, SDHB, SDHC, SDHD, SMAD4, SMARCA4. STK11, TP53, TSC1, TSC2, and VHL.  The following genes  were evaluated for sequence changes only: SDHA and HOXB13 c.251G>A variant only.  The test report has been scanned into EPIC and is located under the Molecular Pathology section of the Results Review tab.   We discussed with Mr. Kalas that since the current genetic  testing is not perfect, it is possible there may be a gene mutation in one of these genes that current testing cannot detect, but that chance is small. We also discussed, that it is possible that another gene that has not yet been discovered, or that we have not yet tested, is responsible for the cancer diagnoses in the family, and it is, therefore, important to remain in touch with cancer genetics in the future so that we can continue to offer Mr. Byron the most up to date genetic testing.   Genetic testing did detect two Variant of Unknown Significance -one in the APC gene called c.-30583_-30582delinsGG (Non-Coding) and the other in the POLD1 gene called c.497G>A (p.Arg166Gln).  At this time, it is unknown if these variants are associated with increased cancer risk or if they are a normal finding, but most variants such as these get reclassified to being inconsequential. They should not be used to make medical management decisions. With time, we suspect the lab will determine the significance of these variants, if any. If we do learn more about them, we will try to contact Mr. Dempsey to discuss it further. However, it is important to stay in touch with [place] periodically and keep the address and phone number up to date.     CANCER SCREENING RECOMMENDATIONS: This negative genetic test simply tells [place] that we cannot yet define why Mr. Canning has had colorectal cancer at a young age. Mr. Racicot medical management and screening should be based on the prospect that he may be at an increased risk for a second colorectal cancer in the future and should, therefore, undergo more frequent colonoscopy screening at intervals determined by his GI providers.  We also recommended that Mr. Stefanski have an upper endoscopy periodically.  RECOMMENDATIONS FOR FAMILY MEMBERS: Women in this family might be at some increased risk of developing cancer, over the general population risk, simply due to the family history of cancer. We  recommended women in this family have a yearly mammogram beginning at age 49, or 6 years younger than the earliest onset of cancer, an annual clinical breast exam, and perform monthly breast self-exams. Women in this family should also have a gynecological exam as recommended by their primary provider. All family members should have a colonoscopy by age 11.  FOLLOW-UP: Lastly, we discussed with Mr. Power that cancer genetics is a rapidly advancing field and it is possible that new genetic tests will be appropriate for him and/or his family members in the future. We encouraged him to remain in contact with cancer genetics on an annual basis so we can update his personal and family histories and let him know of advances in cancer genetics that may benefit this family.   Our contact number was provided. Mr. Randle questions were answered to his satisfaction, and he knows he is welcome to call us at anytime with additional questions or concerns.   Roma Kayser, MS, [facility] Certified Genetic Counselor [email or web address]

## 2017-03-09 NOTE — H&P (Signed)
History of Present Illness Lucas Ruff MD; 08/09/1658 11:21 AM) The patient is a 47 year old male who presents with colorectal cancer. 47 year old male who presented to the office for evaluation of a newly diagnosed rectal cancer. He does have a family history of colon and rectal cancer. He has had approximately 3-4 months of rectal bleeding. Colonoscopy showed a mid rectal mass. Biopsies confirmed adenocarcinoma. CT scans of the chest abdomen and pelvis were negative for any signs of metastatic disease. CEA was mildly elevated at 3.5. Ultrasound showed a T2 N0 lesion, which was tattooed. He then underwent a robotic-assisted low anterior resection with diverting ileostomy on January 29, 2017. He did well postoperatively and was discharged on postop day 4 in stable condition. He has been doing well since he has been home. His emptying his bag several times a day and urinating several times a day as well. He is having some blistering around the edge of his ostomy site and they are pouching this appropriately. He has no rectal pain or bleeding. He is tolerating a diet without difficulty. He is back at work.   Problem List/Past Medical Lucas Ruff, MD; 06/08/158 11:21 AM) RECTAL CANCER ([facility])  Past Surgical History Lucas Ruff, MD; 1/0/9323 11:21 AM) Thyroid Surgery  Diagnostic Studies History Lucas Ruff, MD; 05/09/7320 11:21 AM) Colonoscopy within last year  Allergies (Tanisha A. Owens Shark, [place]; 03/09/2017 11:10 AM) No Known Drug Allergies [01/07/2017]: Allergies Reconciled  Medication History (Tanisha A. Owens Shark, RMA; 03/09/2017 11:10 AM) Fenofibrate (160MG  Tablet, Oral) Active. Rosuvastatin Calcium (40MG  Tablet, Oral) Active. Multiple Vitamin (Oral) Active. Fish Oil (1000MG  Capsule, Oral) Active. Medications Reconciled  Social History Lucas Ruff, MD; 0/02/5425 11:21 AM) Alcohol use Occasional alcohol use. Caffeine use Coffee, Tea. Tobacco use Current every day  smoker.  Family History Lucas Ruff, MD; 0/06/2374 11:21 AM) Colon Cancer Mother. Heart Disease Father. Heart disease in male family member before age 26 Respiratory Condition Father.  Other Problems Lucas Ruff, MD; 02/12/3149 11:21 AM) Gastroesophageal Reflux Disease Hypercholesterolemia Thyroid Disease     Review of Systems Lucas Ruff MD; 07/10/1605 11:21 AM) General Not Present- Appetite Loss, Chills, Fatigue, Fever, Night Sweats, Weight Gain and Weight Loss. Skin Not Present- Change in Wart/Mole, Dryness, Hives, Jaundice, New Lesions, Non-Healing Wounds, Rash and Ulcer. HEENT Not Present- Earache, Hearing Loss, Hoarseness, Nose Bleed, Oral Ulcers, Ringing in the Ears, Seasonal Allergies, Sinus Pain, Sore Throat, Visual Disturbances, Wears glasses/contact lenses and Yellow Eyes. Respiratory Present- Snoring. Not Present- Bloody sputum, Chronic Cough, Difficulty Breathing and Wheezing. Breast Not Present- Breast Mass, Breast Pain, Nipple Discharge and Skin Changes. Cardiovascular Not Present- Chest Pain, Difficulty Breathing Lying Down, Leg Cramps, Palpitations, Rapid Heart Rate, Shortness of Breath and Swelling of Extremities. Gastrointestinal Present- Bloody Stool. Not Present- Abdominal Pain, Bloating, Change in Bowel Habits, Chronic diarrhea, Constipation, Difficulty Swallowing, Excessive gas, Gets full quickly at meals, Hemorrhoids, Indigestion, Nausea, Rectal Pain and Vomiting. Male Genitourinary Not Present- Blood in Urine, Change in Urinary Stream, Frequency, Impotence, Nocturia, Painful Urination, Urgency and Urine Leakage. Musculoskeletal Not Present- Back Pain, Joint Pain, Joint Stiffness, Muscle Pain, Muscle Weakness and Swelling of Extremities. Neurological Not Present- Decreased Memory, Fainting, Headaches, Numbness, Seizures, Tingling, Tremor, Trouble walking and Weakness. Psychiatric Not Present- Anxiety, Bipolar, Change in Sleep Pattern, Depression, Fearful  and Frequent crying. Endocrine Not Present- Cold Intolerance, Excessive Hunger, Hair Changes, Heat Intolerance and New Diabetes. Hematology Not Present- Blood Thinners, Easy Bruising, Excessive bleeding, Gland problems, HIV and Persistent Infections.  Vitals (Tanisha A. Owens Shark RMA;  03/09/2017 11:10 AM) 03/09/2017 11:09 AM Weight: 225.4 lb Height: 74in Body Surface Area: 2.29 m Body Mass Index: 28.94 kg/m  Temp.: 98.71F  Pulse: 81 (Regular)  BP: 116/72 (Sitting, Left Arm, Standard)      Physical Exam Lucas Ruff MD; 03/10/1441 11:22 AM)  General Mental Status-Alert. General Appearance-Not in acute distress. Build & Nutrition-Well nourished. Posture-Normal posture. Gait-Normal.  Head and Neck Head-normocephalic, atraumatic with no lesions or palpable masses. Trachea-midline.  Chest and Lung Exam Chest and lung exam reveals -on auscultation, normal breath sounds, no adventitious sounds and normal vocal resonance.  Cardiovascular Cardiovascular examination reveals -normal heart sounds, regular rate and rhythm with no murmurs and femoral artery auscultation bilaterally reveals normal pulses, no bruits, no thrills.  Abdomen Inspection Inspection of the abdomen reveals - No Hernias. Palpation/Percussion Palpation and Percussion of the abdomen reveal - Soft, Non Tender, No Rigidity (guarding), No hepatosplenomegaly and No Palpable abdominal masses.  Rectal Anorectal Exam Internal - Note: Anastomosis palpated at approximately 6 cm. No stricturing noted. Appears to be healing appropriately.  Neurologic Neurologic evaluation reveals -alert and oriented x 3 with no impairment of recent or remote memory, normal attention span and ability to concentrate, normal sensation and normal coordination.  Musculoskeletal Normal Exam - Bilateral-Upper Extremity Strength Normal and Lower Extremity Strength Normal.    Assessment & Plan Lucas Ruff MD;  01/09/4006 11:22 AM)  RECTAL CANCER ([facility]) Impression: Patient is doing great after robotic-assisted low anterior resection. On exam today his anastomosis appears to be healing well. There is no stricture. I recommended reversing his ostomy at approximately 8 weeks. We will work on getting this scheduled today. Risks discussed include bleeding, infection, leak and hernia. I believe he understands this and is willing to proceed. We also briefly discussed the change in bowel habits that he will note after his reversal is performed.

## 2017-03-09 NOTE — H&P (View-Only) (Signed)
History of Present Illness Lucas Ruff MD; 06/09/3327 11:21 AM) The patient is a 47 year old male who presents with colorectal cancer. 47 year old male who presented to the office for evaluation of a newly diagnosed rectal cancer. He does have a family history of colon and rectal cancer. He has had approximately 3-4 months of rectal bleeding. Colonoscopy showed a mid rectal mass. Biopsies confirmed adenocarcinoma. CT scans of the chest abdomen and pelvis were negative for any signs of metastatic disease. CEA was mildly elevated at 3.5. Ultrasound showed a T2 N0 lesion, which was tattooed. He then underwent a robotic-assisted low anterior resection with diverting ileostomy on January 29, 2017. He did well postoperatively and was discharged on postop day 4 in stable condition. He has been doing well since he has been home. His emptying his bag several times a day and urinating several times a day as well. He is having some blistering around the edge of his ostomy site and they are pouching this appropriately. He has no rectal pain or bleeding. He is tolerating a diet without difficulty. He is back at work.   Problem List/Past Medical Lucas Ruff, MD; 05/06/8839 11:21 AM) RECTAL CANCER (C20)  Past Surgical History Lucas Ruff, MD; 06/10/628 11:21 AM) Thyroid Surgery  Diagnostic Studies History Lucas Ruff, MD; 01/11/107 11:21 AM) Colonoscopy within last year  Allergies (Tanisha A. Owens Shark, [place]; 03/09/2017 11:10 AM) No Known Drug Allergies [01/07/2017]: Allergies Reconciled  Medication History (Tanisha A. Owens Shark, RMA; 03/09/2017 11:10 AM) Fenofibrate (160MG  Tablet, Oral) Active. Rosuvastatin Calcium (40MG  Tablet, Oral) Active. Multiple Vitamin (Oral) Active. Fish Oil (1000MG  Capsule, Oral) Active. Medications Reconciled  Social History Lucas Ruff, MD; 03/07/3555 11:21 AM) Alcohol use Occasional alcohol use. Caffeine use Coffee, Tea. Tobacco use Current every day  smoker.  Family History Lucas Ruff, MD; 03/07/2023 11:21 AM) Colon Cancer Mother. Heart Disease Father. Heart disease in male family member before age 78 Respiratory Condition Father.  Other Problems Lucas Ruff, MD; 04/06/7060 11:21 AM) Gastroesophageal Reflux Disease Hypercholesterolemia Thyroid Disease     Review of Systems Lucas Ruff MD; 03/11/6281 11:21 AM) General Not Present- Appetite Loss, Chills, Fatigue, Fever, Night Sweats, Weight Gain and Weight Loss. Skin Not Present- Change in Wart/Mole, Dryness, Hives, Jaundice, New Lesions, Non-Healing Wounds, Rash and Ulcer. HEENT Not Present- Earache, Hearing Loss, Hoarseness, Nose Bleed, Oral Ulcers, Ringing in the Ears, Seasonal Allergies, Sinus Pain, Sore Throat, Visual Disturbances, Wears glasses/contact lenses and Yellow Eyes. Respiratory Present- Snoring. Not Present- Bloody sputum, Chronic Cough, Difficulty Breathing and Wheezing. Breast Not Present- Breast Mass, Breast Pain, Nipple Discharge and Skin Changes. Cardiovascular Not Present- Chest Pain, Difficulty Breathing Lying Down, Leg Cramps, Palpitations, Rapid Heart Rate, Shortness of Breath and Swelling of Extremities. Gastrointestinal Present- Bloody Stool. Not Present- Abdominal Pain, Bloating, Change in Bowel Habits, Chronic diarrhea, Constipation, Difficulty Swallowing, Excessive gas, Gets full quickly at meals, Hemorrhoids, Indigestion, Nausea, Rectal Pain and Vomiting. Male Genitourinary Not Present- Blood in Urine, Change in Urinary Stream, Frequency, Impotence, Nocturia, Painful Urination, Urgency and Urine Leakage. Musculoskeletal Not Present- Back Pain, Joint Pain, Joint Stiffness, Muscle Pain, Muscle Weakness and Swelling of Extremities. Neurological Not Present- Decreased Memory, Fainting, Headaches, Numbness, Seizures, Tingling, Tremor, Trouble walking and Weakness. Psychiatric Not Present- Anxiety, Bipolar, Change in Sleep Pattern, Depression, Fearful  and Frequent crying. Endocrine Not Present- Cold Intolerance, Excessive Hunger, Hair Changes, Heat Intolerance and New Diabetes. Hematology Not Present- Blood Thinners, Easy Bruising, Excessive bleeding, Gland problems, HIV and Persistent Infections.  Vitals (Tanisha A. Owens Shark RMA;  03/09/2017 11:10 AM) 03/09/2017 11:09 AM Weight: 225.4 lb Height: 74in Body Surface Area: 2.29 m Body Mass Index: 28.94 kg/m  Temp.: 98.29F  Pulse: 81 (Regular)  BP: 116/72 (Sitting, Left Arm, Standard)      Physical Exam Lucas Ruff MD; 07/08/2593 11:22 AM)  General Mental Status-Alert. General Appearance-Not in acute distress. Build & Nutrition-Well nourished. Posture-Normal posture. Gait-Normal.  Head and Neck Head-normocephalic, atraumatic with no lesions or palpable masses. Trachea-midline.  Chest and Lung Exam Chest and lung exam reveals -on auscultation, normal breath sounds, no adventitious sounds and normal vocal resonance.  Cardiovascular Cardiovascular examination reveals -normal heart sounds, regular rate and rhythm with no murmurs and femoral artery auscultation bilaterally reveals normal pulses, no bruits, no thrills.  Abdomen Inspection Inspection of the abdomen reveals - No Hernias. Palpation/Percussion Palpation and Percussion of the abdomen reveal - Soft, Non Tender, No Rigidity (guarding), No hepatosplenomegaly and No Palpable abdominal masses.  Rectal Anorectal Exam Internal - Note: Anastomosis palpated at approximately 6 cm. No stricturing noted. Appears to be healing appropriately.  Neurologic Neurologic evaluation reveals -alert and oriented x 3 with no impairment of recent or remote memory, normal attention span and ability to concentrate, normal sensation and normal coordination.  Musculoskeletal Normal Exam - Bilateral-Upper Extremity Strength Normal and Lower Extremity Strength Normal.    Assessment & Plan Lucas Ruff MD;  06/08/8754 11:22 AM)  RECTAL CANCER (C20) Impression: Patient is doing great after robotic-assisted low anterior resection. On exam today his anastomosis appears to be healing well. There is no stricture. I recommended reversing his ostomy at approximately 8 weeks. We will work on getting this scheduled today. Risks discussed include bleeding, infection, leak and hernia. I believe he understands this and is willing to proceed. We also briefly discussed the change in bowel habits that he will note after his reversal is performed.

## 2017-03-23 NOTE — Patient Instructions (Addendum)
Lucas Morrison  03/23/2017   Your procedure is scheduled on: 03-31-17  Report to [facility] Main  Entrance   ARRIVE AT 530 AM. Have a seat in the Main Lobby. Please note there is a phone at the [employer]. Please call [phone] on that phone. Someone from Short Stay will come and get you from the Main Lobby and take you to Short Stay.   Call this number if you have problems the morning of surgery [phone]   Remember: [place]! [place] 2 PRESURGERY ENSURE DRINKS THE NIGHT BEFORE SURGERY AT 10:00 PM AND 1 PRESURGERY DRINK THE DAY OF THE PROCEDURE 3 HOURS PRIOR TO SCHEDULED SURGERY. NO SOLIDS AFTER MIDNIGHT THE DAY PRIOR TO THE SURGERY. NOTHING BY MOUTH EXCEPT CLEAR LIQUIDS UNTIL THREE HOURS PRIOR TO SCHEDULED SURGERY. PLEASE FINISH PRESURGERY ENSURE DRINK PER SURGEON ORDER 3 HOURS PRIOR TO SCHEDULED SURGERY TIME WHICH NEEDS TO BE COMPLETED AT ___4:30AM____.    Take these medicines the morning of surgery with A SIP OF WATER:  None !                                You may not have any metal on your body including hair pins and              piercings  Do not wear jewelry, make-up, lotions, powders or perfumes, deodorant                       Men may shave face and neck.   Do not bring valuables to the hospital. [place].  Contacts, dentures or bridgework may not be worn into surgery.  Leave suitcase in the car. After surgery it may be brought to your room.                Please read over the following fact sheets you were given: _____________________________________________________________________     CLEAR LIQUID DIET   Foods Allowed                                                                     Foods Excluded  Coffee and tea, regular and decaf                             liquids that you cannot  Plain Jell-O in any flavor                                              see through such as: Fruit ices (not with fruit pulp)                                     milk, soups, orange juice  Iced Popsicles                                    All solid food Carbonated beverages, regular and diet                                    Cranberry, grape and apple juices Sports drinks like Gatorade Lightly seasoned clear broth or consume(fat free) Sugar, honey syrup  Sample Menu Breakfast                                Lunch                                     Supper Cranberry juice                    Beef broth                            Chicken broth Jell-O                                     Grape juice                           Apple juice Coffee or tea                        Jell-O                                      Popsicle                                                Coffee or tea                        Coffee or tea  _____________________________________________________________________    [facility] - Preparing for Surgery Before surgery, you can play an important role.  Because skin is not sterile, your skin needs to be as free of germs as possible.  You can reduce the number of germs on your skin by washing with CHG (chlorahexidine gluconate) soap before surgery.  CHG is an antiseptic cleaner which kills germs and bonds with the skin to continue killing germs even after washing. Please DO NOT use if you have an allergy to CHG or antibacterial soaps.  If your skin becomes reddened/irritated stop using the CHG and inform your nurse when you arrive at Short Stay. Do not shave (including legs and underarms) for at least 48 hours prior to the first CHG shower.  You may shave your face/neck. Please follow these instructions carefully:  1.  Shower with CHG Soap the night before surgery and the  morning of Surgery.  2.  If you choose to wash your hair, wash  your hair first as usual with your  normal  shampoo.  3.  After you shampoo,  rinse your hair and body thoroughly to remove the  shampoo.                           4.  Use CHG as you would any other liquid soap.  You can apply chg directly  to the skin and wash                       Gently with a scrungie or clean washcloth.  5.  Apply the CHG Soap to your body ONLY FROM THE NECK DOWN.   Do not use on face/ open                           Wound or open sores. Avoid contact with eyes, ears mouth and genitals (private parts).                       Wash face,  Genitals (private parts) with your normal soap.             6.  Wash thoroughly, paying special attention to the area where your surgery  will be performed.  7.  Thoroughly rinse your body with warm water from the neck down.  8.  DO NOT shower/wash with your normal soap after using and rinsing off  the CHG Soap.                9.  Pat yourself dry with a clean towel.            10.  Wear clean pajamas.            11.  Place clean sheets on your bed the night of your first shower and do not  sleep with pets. Day of Surgery : Do not apply any lotions/deodorants the morning of surgery.  Please wear clean clothes to the hospital/surgery center.  FAILURE TO FOLLOW THESE INSTRUCTIONS MAY RESULT IN THE CANCELLATION OF YOUR SURGERY PATIENT SIGNATURE_________________________________  NURSE SIGNATURE__________________________________  ________________________________________________________________________

## 2017-03-24 ENCOUNTER — Other Ambulatory Visit: Payer: Self-pay

## 2017-03-24 ENCOUNTER — Encounter (HOSPITAL_COMMUNITY)
Admission: RE | Admit: 2017-03-24 | Discharge: 2017-03-24 | Disposition: A | Discharge status: Home / Self Care | Payer: BLUE CROSS/BLUE SHIELD | Source: Ambulatory Visit | Attending: General Surgery | Admitting: General Surgery

## 2017-03-24 ENCOUNTER — Encounter (HOSPITAL_COMMUNITY): Payer: Self-pay

## 2017-03-24 DIAGNOSIS — Z01812 Encounter for preprocedural laboratory examination: Secondary | ICD-10-CM | POA: Insufficient documentation

## 2017-03-24 LAB — CBC
HEMATOCRIT: 43.3 % (ref 39.0–52.0)
Hemoglobin: 14.7 g/dL (ref 13.0–17.0)
MCH: 28.4 pg (ref 26.0–34.0)
MCHC: 33.9 g/dL (ref 30.0–36.0)
MCV: 83.6 fL (ref 78.0–100.0)
Platelets: 253 10*3/uL (ref 150–400)
RBC: 5.18 MIL/uL (ref 4.22–5.81)
RDW: 13.3 % (ref 11.5–15.5)
WBC: 6.1 10*3/uL (ref 4.0–10.5)

## 2017-03-31 ENCOUNTER — Inpatient Hospital Stay (HOSPITAL_COMMUNITY)
Admission: RE | Admit: 2017-03-31 | Discharge: 2017-04-03 | DRG: 330 | Disposition: A | Discharge status: Home / Self Care | Payer: BLUE CROSS/BLUE SHIELD | Source: Ambulatory Visit | Attending: General Surgery | Admitting: General Surgery

## 2017-03-31 ENCOUNTER — Inpatient Hospital Stay (HOSPITAL_COMMUNITY): Payer: BLUE CROSS/BLUE SHIELD | Admitting: Certified Registered Nurse Anesthetist

## 2017-03-31 ENCOUNTER — Other Ambulatory Visit: Payer: Self-pay

## 2017-03-31 ENCOUNTER — Encounter (HOSPITAL_COMMUNITY): Payer: Self-pay

## 2017-03-31 ENCOUNTER — Encounter (HOSPITAL_COMMUNITY)
Admission: RE | Disposition: A | Discharge status: Home / Self Care | Payer: Self-pay | Source: Ambulatory Visit | Attending: General Surgery

## 2017-03-31 DIAGNOSIS — E7800 Pure hypercholesterolemia, unspecified: Secondary | ICD-10-CM | POA: Diagnosis present

## 2017-03-31 DIAGNOSIS — Z8249 Family history of ischemic heart disease and other diseases of the circulatory system: Secondary | ICD-10-CM | POA: Diagnosis not present

## 2017-03-31 DIAGNOSIS — Z800 Family history of malignant neoplasm of digestive organs: Secondary | ICD-10-CM | POA: Diagnosis not present

## 2017-03-31 DIAGNOSIS — C19 Malignant neoplasm of rectosigmoid junction: Secondary | ICD-10-CM | POA: Diagnosis present

## 2017-03-31 DIAGNOSIS — Z432 Encounter for attention to ileostomy: Secondary | ICD-10-CM | POA: Diagnosis not present

## 2017-03-31 DIAGNOSIS — E079 Disorder of thyroid, unspecified: Secondary | ICD-10-CM | POA: Diagnosis present

## 2017-03-31 DIAGNOSIS — K660 Peritoneal adhesions (postprocedural) (postinfection): Secondary | ICD-10-CM | POA: Diagnosis present

## 2017-03-31 DIAGNOSIS — K219 Gastro-esophageal reflux disease without esophagitis: Secondary | ICD-10-CM | POA: Diagnosis present

## 2017-03-31 DIAGNOSIS — Z79899 Other long term (current) drug therapy: Secondary | ICD-10-CM | POA: Diagnosis not present

## 2017-03-31 DIAGNOSIS — C20 Malignant neoplasm of rectum: Secondary | ICD-10-CM | POA: Diagnosis present

## 2017-03-31 DIAGNOSIS — F17200 Nicotine dependence, unspecified, uncomplicated: Secondary | ICD-10-CM | POA: Diagnosis present

## 2017-03-31 HISTORY — PX: ILEOSTOMY: SHX1783

## 2017-03-31 SURGERY — CREATION, ILEOSTOMY
Anesthesia: General | Site: Abdomen

## 2017-03-31 MED ORDER — PROMETHAZINE HCL 25 MG/ML IJ SOLN: 6.2500 mg | INTRAMUSCULAR | Status: DC | PRN

## 2017-03-31 MED ORDER — 0.9 % SODIUM CHLORIDE (POUR BTL) OPTIME
TOPICAL | Status: DC | PRN
  Administered 2017-03-31: 2000 mL

## 2017-03-31 MED ORDER — MIDAZOLAM HCL 5 MG/5ML IJ SOLN
INTRAMUSCULAR | Status: DC | PRN
  Administered 2017-03-31: 2 mg via INTRAVENOUS

## 2017-03-31 MED ORDER — ROSUVASTATIN CALCIUM 40 MG PO TABS
40.0000 mg | ORAL_TABLET | Freq: Every day | ORAL | Status: DC
  Administered 2017-03-31: 40 mg via ORAL
  Filled 2017-03-31: qty 2
  Filled 2017-03-31 (×2): qty 1
  Filled 2017-03-31: qty 2

## 2017-03-31 MED ORDER — SUCCINYLCHOLINE CHLORIDE 200 MG/10ML IV SOSY
PREFILLED_SYRINGE | INTRAVENOUS | Status: DC | PRN
  Administered 2017-03-31: 100 mg via INTRAVENOUS

## 2017-03-31 MED ORDER — FENTANYL CITRATE (PF) 100 MCG/2ML IJ SOLN
INTRAMUSCULAR | Status: AC
  Filled 2017-03-31: qty 2

## 2017-03-31 MED ORDER — LACTATED RINGERS IV SOLN: INTRAVENOUS | Status: DC

## 2017-03-31 MED ORDER — FENTANYL CITRATE (PF) 100 MCG/2ML IJ SOLN
25.0000 ug | INTRAMUSCULAR | Status: DC | PRN
  Administered 2017-03-31: 25 ug via INTRAVENOUS
  Administered 2017-03-31 (×2): 50 ug via INTRAVENOUS
  Administered 2017-03-31: 25 ug via INTRAVENOUS

## 2017-03-31 MED ORDER — DEXAMETHASONE SODIUM PHOSPHATE 10 MG/ML IJ SOLN
INTRAMUSCULAR | Status: AC
  Filled 2017-03-31: qty 1

## 2017-03-31 MED ORDER — SUGAMMADEX SODIUM 200 MG/2ML IV SOLN
INTRAVENOUS | Status: DC | PRN
  Administered 2017-03-31: 250 mg via INTRAVENOUS

## 2017-03-31 MED ORDER — KETOROLAC TROMETHAMINE 30 MG/ML IJ SOLN
INTRAMUSCULAR | Status: AC
  Filled 2017-03-31: qty 1

## 2017-03-31 MED ORDER — MORPHINE SULFATE (PF) 4 MG/ML IV SOLN
INTRAVENOUS | Status: AC
  Filled 2017-03-31: qty 1

## 2017-03-31 MED ORDER — LIDOCAINE 2% (20 MG/ML) 5 ML SYRINGE
INTRAMUSCULAR | Status: DC | PRN
  Administered 2017-03-31: 100 mg via INTRAVENOUS

## 2017-03-31 MED ORDER — ALVIMOPAN 12 MG PO CAPS
12.0000 mg | ORAL_CAPSULE | Freq: Two times a day (BID) | ORAL | Status: DC
  Administered 2017-04-01 (×2): 12 mg via ORAL
  Filled 2017-03-31 (×2): qty 1

## 2017-03-31 MED ORDER — DEXAMETHASONE SODIUM PHOSPHATE 10 MG/ML IJ SOLN
INTRAMUSCULAR | Status: DC | PRN
  Administered 2017-03-31: 8 mg via INTRAVENOUS

## 2017-03-31 MED ORDER — PROPOFOL 10 MG/ML IV BOLUS
INTRAVENOUS | Status: DC | PRN
  Administered 2017-03-31: 200 mg via INTRAVENOUS

## 2017-03-31 MED ORDER — BUPIVACAINE HCL (PF) 0.25 % IJ SOLN
INTRAMUSCULAR | Status: DC | PRN
  Administered 2017-03-31: 30 mL

## 2017-03-31 MED ORDER — FENOFIBRATE 160 MG PO TABS
160.0000 mg | ORAL_TABLET | Freq: Every day | ORAL | Status: DC
  Administered 2017-04-01 – 2017-04-02 (×2): 160 mg via ORAL
  Filled 2017-03-31 (×4): qty 1

## 2017-03-31 MED ORDER — ROCURONIUM BROMIDE 10 MG/ML (PF) SYRINGE
PREFILLED_SYRINGE | INTRAVENOUS | Status: AC
  Filled 2017-03-31: qty 5

## 2017-03-31 MED ORDER — FENTANYL CITRATE (PF) 250 MCG/5ML IJ SOLN
INTRAMUSCULAR | Status: DC | PRN
  Administered 2017-03-31 (×3): 50 ug via INTRAVENOUS

## 2017-03-31 MED ORDER — KCL IN DEXTROSE-NACL 20-5-0.45 MEQ/L-%-% IV SOLN
INTRAVENOUS | Status: DC
  Administered 2017-03-31 – 2017-04-01 (×2): via INTRAVENOUS
  Filled 2017-03-31 (×2): qty 1000

## 2017-03-31 MED ORDER — LACTATED RINGERS IR SOLN
Status: DC | PRN
  Administered 2017-03-31: 1000 mL

## 2017-03-31 MED ORDER — DIPHENHYDRAMINE HCL 25 MG PO CAPS: 25.0000 mg | ORAL_CAPSULE | Freq: Four times a day (QID) | ORAL | Status: DC | PRN

## 2017-03-31 MED ORDER — GABAPENTIN 300 MG PO CAPS
300.0000 mg | ORAL_CAPSULE | ORAL | Status: AC
  Administered 2017-03-31: 300 mg via ORAL
  Filled 2017-03-31: qty 1

## 2017-03-31 MED ORDER — SUGAMMADEX SODIUM 500 MG/5ML IV SOLN
INTRAVENOUS | Status: AC
  Filled 2017-03-31: qty 5

## 2017-03-31 MED ORDER — MORPHINE SULFATE (PF) 4 MG/ML IV SOLN
2.0000 mg | INTRAVENOUS | Status: DC | PRN
  Administered 2017-03-31: 2 mg via INTRAVENOUS

## 2017-03-31 MED ORDER — KETAMINE HCL 10 MG/ML IJ SOLN
INTRAMUSCULAR | Status: DC | PRN
  Administered 2017-03-31: 45 mg via INTRAVENOUS

## 2017-03-31 MED ORDER — CEFOTETAN DISODIUM-DEXTROSE 2-2.08 GM-%(50ML) IV SOLR
2.0000 g | INTRAVENOUS | Status: AC
  Administered 2017-03-31: 2 g via INTRAVENOUS
  Filled 2017-03-31: qty 50

## 2017-03-31 MED ORDER — MEPERIDINE HCL 50 MG/ML IJ SOLN: 6.2500 mg | INTRAMUSCULAR | Status: DC | PRN

## 2017-03-31 MED ORDER — BUPIVACAINE LIPOSOME 1.3 % IJ SUSP
20.0000 mL | Freq: Once | INTRAMUSCULAR | Status: AC
  Administered 2017-03-31: 20 mL
  Filled 2017-03-31: qty 20

## 2017-03-31 MED ORDER — FENTANYL CITRATE (PF) 250 MCG/5ML IJ SOLN
INTRAMUSCULAR | Status: AC
  Filled 2017-03-31: qty 5

## 2017-03-31 MED ORDER — ONDANSETRON HCL 4 MG/2ML IJ SOLN: 4.0000 mg | Freq: Four times a day (QID) | INTRAMUSCULAR | Status: DC | PRN

## 2017-03-31 MED ORDER — SUCCINYLCHOLINE CHLORIDE 200 MG/10ML IV SOSY
PREFILLED_SYRINGE | INTRAVENOUS | Status: AC
  Filled 2017-03-31: qty 10

## 2017-03-31 MED ORDER — ENOXAPARIN SODIUM 40 MG/0.4ML ~~LOC~~ SOLN
40.0000 mg | SUBCUTANEOUS | Status: DC
  Administered 2017-04-01 – 2017-04-02 (×2): 40 mg via SUBCUTANEOUS
  Filled 2017-03-31 (×2): qty 0.4

## 2017-03-31 MED ORDER — CHLORHEXIDINE GLUCONATE CLOTH 2 % EX PADS: 6.0000 | MEDICATED_PAD | Freq: Once | CUTANEOUS | Status: DC

## 2017-03-31 MED ORDER — MIDAZOLAM HCL 2 MG/2ML IJ SOLN
INTRAMUSCULAR | Status: AC
  Filled 2017-03-31: qty 2

## 2017-03-31 MED ORDER — KETAMINE HCL 10 MG/ML IJ SOLN
INTRAMUSCULAR | Status: AC
  Filled 2017-03-31: qty 1

## 2017-03-31 MED ORDER — EZETIMIBE 10 MG PO TABS
10.0000 mg | ORAL_TABLET | Freq: Every day | ORAL | Status: DC
  Administered 2017-03-31 – 2017-04-02 (×3): 10 mg via ORAL
  Filled 2017-03-31 (×3): qty 1

## 2017-03-31 MED ORDER — LACTATED RINGERS IV SOLN
INTRAVENOUS | Status: DC
  Administered 2017-03-31 (×2): via INTRAVENOUS

## 2017-03-31 MED ORDER — PROPOFOL 10 MG/ML IV BOLUS
INTRAVENOUS | Status: AC
  Filled 2017-03-31: qty 20

## 2017-03-31 MED ORDER — BUPIVACAINE HCL (PF) 0.25 % IJ SOLN
INTRAMUSCULAR | Status: AC
  Filled 2017-03-31: qty 30

## 2017-03-31 MED ORDER — LIDOCAINE HCL 2 % IJ SOLN
INTRAMUSCULAR | Status: AC
  Filled 2017-03-31: qty 20

## 2017-03-31 MED ORDER — KETOROLAC TROMETHAMINE 30 MG/ML IJ SOLN
15.0000 mg | INTRAMUSCULAR | Status: AC
  Administered 2017-03-31: 15 mg via INTRAVENOUS

## 2017-03-31 MED ORDER — LIDOCAINE 2% (20 MG/ML) 5 ML SYRINGE
INTRAMUSCULAR | Status: AC
  Filled 2017-03-31: qty 5

## 2017-03-31 MED ORDER — ROCURONIUM BROMIDE 50 MG/5ML IV SOSY
PREFILLED_SYRINGE | INTRAVENOUS | Status: DC | PRN
  Administered 2017-03-31: 50 mg via INTRAVENOUS

## 2017-03-31 MED ORDER — ALVIMOPAN 12 MG PO CAPS
12.0000 mg | ORAL_CAPSULE | ORAL | Status: AC
  Administered 2017-03-31: 12 mg via ORAL
  Filled 2017-03-31: qty 1

## 2017-03-31 MED ORDER — ACETAMINOPHEN 500 MG PO TABS
1000.0000 mg | ORAL_TABLET | Freq: Four times a day (QID) | ORAL | Status: DC
  Administered 2017-03-31 – 2017-04-03 (×11): 1000 mg via ORAL
  Filled 2017-03-31 (×11): qty 2

## 2017-03-31 MED ORDER — ACETAMINOPHEN 500 MG PO TABS
1000.0000 mg | ORAL_TABLET | ORAL | Status: AC
  Administered 2017-03-31: 1000 mg via ORAL
  Filled 2017-03-31: qty 2

## 2017-03-31 MED ORDER — ONDANSETRON HCL 4 MG/2ML IJ SOLN
INTRAMUSCULAR | Status: AC
  Filled 2017-03-31: qty 2

## 2017-03-31 MED ORDER — ONDANSETRON HCL 4 MG/2ML IJ SOLN
INTRAMUSCULAR | Status: DC | PRN
  Administered 2017-03-31: 4 mg via INTRAVENOUS

## 2017-03-31 MED ORDER — DIPHENHYDRAMINE HCL 50 MG/ML IJ SOLN: 25.0000 mg | Freq: Four times a day (QID) | INTRAMUSCULAR | Status: DC | PRN

## 2017-03-31 MED ORDER — CEFOTETAN DISODIUM-DEXTROSE 2-2.08 GM-%(50ML) IV SOLR
2.0000 g | Freq: Two times a day (BID) | INTRAVENOUS | Status: AC
  Administered 2017-03-31: 2 g via INTRAVENOUS
  Filled 2017-03-31: qty 50

## 2017-03-31 MED ORDER — LIDOCAINE 2% (20 MG/ML) 5 ML SYRINGE
INTRAMUSCULAR | Status: DC | PRN
  Administered 2017-03-31: 1.5 mg/kg/h via INTRAVENOUS

## 2017-03-31 MED ORDER — ONDANSETRON HCL 4 MG PO TABS: 4.0000 mg | ORAL_TABLET | Freq: Four times a day (QID) | ORAL | Status: DC | PRN

## 2017-03-31 MED ORDER — HYDROCODONE-ACETAMINOPHEN 5-325 MG PO TABS: 1.0000 | ORAL_TABLET | ORAL | Status: DC | PRN

## 2017-03-31 SURGICAL SUPPLY — 45 items
BLADE HEX COATED 2.75 (ELECTRODE) ×2 IMPLANT
CHLORAPREP W/TINT 26ML (MISCELLANEOUS) ×2 IMPLANT
COVER MAYO STAND STRL (DRAPES) ×2 IMPLANT
DRAPE LAPAROSCOPIC ABDOMINAL (DRAPES) ×2 IMPLANT
DRAPE UTILITY XL STRL (DRAPES) IMPLANT
DRAPE WARM FLUID 44X44 (DRAPE) ×2 IMPLANT
DRSG OPSITE POSTOP 4X10 (GAUZE/BANDAGES/DRESSINGS) IMPLANT
DRSG OPSITE POSTOP 4X6 (GAUZE/BANDAGES/DRESSINGS) IMPLANT
DRSG OPSITE POSTOP 4X8 (GAUZE/BANDAGES/DRESSINGS) IMPLANT
DRSG TELFA 3X8 NADH (GAUZE/BANDAGES/DRESSINGS) ×2 IMPLANT
ELECT REM PT RETURN 15FT ADLT (MISCELLANEOUS) ×2 IMPLANT
GAUZE SPONGE 4X4 12PLY STRL (GAUZE/BANDAGES/DRESSINGS) ×2 IMPLANT
GLOVE BIO SURGEON STRL SZ 6.5 (GLOVE) ×4 IMPLANT
GLOVE BIOGEL PI IND STRL 7.0 (GLOVE) ×1 IMPLANT
GLOVE BIOGEL PI INDICATOR 7.0 (GLOVE) ×1
GOWN STRL REUS W/TWL 2XL LVL3 (GOWN DISPOSABLE) ×2 IMPLANT
GOWN STRL REUS W/TWL XL LVL3 (GOWN DISPOSABLE) ×2 IMPLANT
HANDLE SUCTION POOLE (INSTRUMENTS) ×1 IMPLANT
HOLDER FOLEY CATH W/STRAP (MISCELLANEOUS) IMPLANT
KIT BASIN OR (CUSTOM PROCEDURE TRAY) ×2 IMPLANT
MANIFOLD NEPTUNE II (INSTRUMENTS) ×2 IMPLANT
PACK GENERAL/GYN (CUSTOM PROCEDURE TRAY) ×2 IMPLANT
RELOAD PROXIMATE 75MM BLUE (ENDOMECHANICALS) ×2 IMPLANT
SEALER TISSUE X1 CVD JAW (INSTRUMENTS) IMPLANT
SPONGE LAP 18X18 X RAY DECT (DISPOSABLE) IMPLANT
STAPLER GUN LINEAR PROX 60 (STAPLE) ×2 IMPLANT
STAPLER PROXIMATE 75MM BLUE (STAPLE) ×2 IMPLANT
SUCTION POOLE HANDLE (INSTRUMENTS) ×2
SUT NOVA NAB DX-16 0-1 5-0 T12 (SUTURE) IMPLANT
SUT NOVA NAB GS-21 0 18 T12 DT (SUTURE) ×4 IMPLANT
SUT PROLENE 2 0 BLUE (SUTURE) IMPLANT
SUT SILK 2 0 (SUTURE) ×1
SUT SILK 2 0 SH CR/8 (SUTURE) ×2 IMPLANT
SUT SILK 2-0 18XBRD TIE 12 (SUTURE) ×1 IMPLANT
SUT SILK 3 0 (SUTURE) ×1
SUT SILK 3 0 SH CR/8 (SUTURE) ×2 IMPLANT
SUT SILK 3-0 18XBRD TIE 12 (SUTURE) ×1 IMPLANT
SUT VIC AB 2-0 SH 18 (SUTURE) IMPLANT
SUT VIC AB 2-0 SH 27 (SUTURE) ×2
SUT VIC AB 2-0 SH 27X BRD (SUTURE) ×2 IMPLANT
SUT VIC AB 4-0 PS2 18 (SUTURE) ×2 IMPLANT
TAPE CLOTH SURG 4X10 WHT LF (GAUZE/BANDAGES/DRESSINGS) ×2 IMPLANT
TOWEL OR 17X26 10 PK STRL BLUE (TOWEL DISPOSABLE) ×4 IMPLANT
TOWEL OR NON WOVEN STRL DISP B (DISPOSABLE) ×4 IMPLANT
YANKAUER SUCT BULB TIP NO VENT (SUCTIONS) ×2 IMPLANT

## 2017-03-31 NOTE — Transfer of Care (Signed)
Immediate Anesthesia Transfer of Care Note  Patient: Lucas Morrison  Procedure(s) Performed: ILEOSTOMY REVERSAL ERAS PATHWAY (N/A Abdomen)  Patient Location: PACU  Anesthesia Type:General  Level of Consciousness: awake, alert  and oriented  Airway & Oxygen Therapy: Patient Spontanous Breathing and Patient connected to face mask oxygen  Post-op Assessment: Report given to RN and Post -op Vital signs reviewed and stable  Post vital signs: Reviewed and stable  Last Vitals:  Vitals Value Taken Time  BP    Temp    Pulse 64 03/31/2017  9:59 AM  Resp 15 03/31/2017  9:59 AM  SpO2 96 % 03/31/2017  9:59 AM  Vitals shown include unvalidated device data.  Last Pain:  Vitals:   03/31/17 0555  TempSrc:   PainSc: 0-No pain         Complications: No apparent anesthesia complications

## 2017-03-31 NOTE — Anesthesia Procedure Notes (Addendum)
Procedure Name: Intubation Date/Time: 03/31/2017 8:51 AM Performed by: Maxwell Caul, CRNA Pre-anesthesia Checklist: Patient identified, Emergency Drugs available, Suction available and Patient being monitored Patient Re-evaluated:Patient Re-evaluated prior to induction Oxygen Delivery Method: Circle system utilized Preoxygenation: Pre-oxygenation with 100% oxygen Induction Type: IV induction Ventilation: Mask ventilation without difficulty Laryngoscope Size: Mac and 4 Grade View: Grade I Tube type: Oral Tube size: 7.5 mm Number of attempts: 1 Airway Equipment and Method: Stylet Placement Confirmation: ETT inserted through vocal cords under direct vision,  positive ETCO2 and breath sounds checked- equal and bilateral Secured at: 22 cm Tube secured with: Tape Dental Injury: Teeth and Oropharynx as per pre-operative assessment

## 2017-03-31 NOTE — Interval H&P Note (Signed)
no changes in his health history.   ready for surgery.

## 2017-03-31 NOTE — Op Note (Signed)
03/31/2017  9:46 AM  PATIENT:  Lucas Morrison  47 y.o. adult  Patient Care Team: Samella Parr as PCP - General  PRE-OPERATIVE DIAGNOSIS:  Rectal cancer, diverting ileostomy  POST-OPERATIVE DIAGNOSIS:  Rectal cancer, diverting ileostomy  PROCEDURE:  ILEOSTOMY REVERSAL   Surgeon(s): Leighton Ruff, MD  ASSISTANT: none   ANESTHESIA:   local and general  EBL: 55ml  Total I/O In: -  Out: 10 [Blood:10]  DRAINS: none   SPECIMEN:  Source of Specimen:  ileostomy  DISPOSITION OF SPECIMEN:  PATHOLOGY  COUNTS:  YES  PLAN OF CARE: Admit to inpatient   PATIENT DISPOSITION:  PACU - hemodynamically stable.  INDICATION: 47 y.o. M s/p LAR and diverting ileostomy for rectal cancer   OR FINDINGS: normal appearing ileostomy  DESCRIPTION: the patient was identified in the preoperative holding area and taken to the OR where they were laid supine on the operating room table.  General anesthesia was induced without difficulty. SCDs were also noted to be in place prior to the initiation of anesthesia.  The patient was then prepped and draped in the usual sterile fashion.   A surgical timeout was performed indicating the correct patient, procedure, positioning and need for preoperative antibiotics.   I began by making an incision around the ileostomy using electrocautery.  Dissection was carried down through subcutaneous tissues separating the ileostomy from the subcutaneous fat.  The fascia was then identified.  I was able to bluntly introduce my finger below the fascia and sweep the adhesions off of the abdominal wall.  The remaining adhesions to the fascia were cut using Metzenbaum scissors.  Once the ileostomy was free circumferentially, I was able to place a GIA 75 mm blue load stapler into both ends of the ileostomy and create a new anastomosis.  The common enterotomy channel was closed using a 60 mm TA stapler.  The remaining ileostomy was transected with an additional load of the  GIA 75 mm blue load stapler.  The remaining mesentery was divided using Kelly clamps and 2-0 silk sutures.  The specimen was sent to pathology for further examination.  The subcutaneous tissue was then mobilized off of the fascial edges using electrocautery.  Hemostasis was good.  The fascia was closed using interrupted #1 Novafil sutures.  The subcutaneous tissue was then closed over this using a 2-0 Vicryl pursestring suture leaving an open area in the middle.  A 2 oh pursestring suture was also used to close down the dermal layer.  This left a small opening in the middle for which a Telfa wick was placed.  A 4 x 4 dressing and tape was placed over this.  An abdominal binder was placed and the patient was then awakened from anesthesia and sent to the postanesthesia care unit stable condition.  All counts were correct per operating room staff.

## 2017-03-31 NOTE — Anesthesia Preprocedure Evaluation (Addendum)
Anesthesia Evaluation  Patient identified by MRN, date of birth, ID band  Reviewed: Allergy & Precautions, Patient's Chart, lab work & pertinent test results  Airway Mallampati: I  TM Distance: >3 FB Neck ROM: Full    Dental  (+) Teeth Intact, Missing, Dental Advisory Given,    Pulmonary former smoker,    breath sounds clear to auscultation       Cardiovascular negative cardio ROS   Rhythm:Regular Rate:Normal     Neuro/Psych negative neurological ROS  negative psych ROS   GI/Hepatic negative GI ROS, Neg liver ROS,   Endo/Other  negative endocrine ROS  Renal/GU negative Renal ROS     Musculoskeletal  (+) Arthritis ,   Abdominal Normal abdominal exam  (+)   Peds  Hematology negative hematology ROS (+)   Anesthesia Other Findings   Reproductive/Obstetrics negative OB ROS                            Lab Results  Component Value Date   WBC 6.1 03/24/2017   HGB 14.7 03/24/2017   HCT 43.3 03/24/2017   MCV 83.6 03/24/2017   PLT 253 03/24/2017   Lab Results  Component Value Date   CREATININE 1.10 02/02/2017   BUN 13 02/02/2017   NA 137 02/02/2017   K 3.8 02/02/2017   CL 104 02/02/2017   CO2 26 02/02/2017     Anesthesia Physical Anesthesia Plan  ASA: II  Anesthesia Plan: General   Post-op Pain Management:    Induction: Intravenous  PONV Risk Score and Plan: 3 and Ondansetron, Dexamethasone and Midazolam  Airway Management Planned: Oral ETT  Additional Equipment: None  Intra-op Plan:   Post-operative Plan: Extubation in OR  Informed Consent: I have reviewed the patients History and Physical, chart, labs and discussed the procedure including the risks, benefits and alternatives for the proposed anesthesia with the patient or authorized representative who has indicated his/her understanding and acceptance.   Dental advisory given  Plan Discussed with: CRNA  Anesthesia  Plan Comments:        Anesthesia Quick Evaluation

## 2017-03-31 NOTE — Anesthesia Postprocedure Evaluation (Signed)
Anesthesia Post Note  Patient: Lucas Morrison  Procedure(s) Performed: ILEOSTOMY REVERSAL ERAS PATHWAY (N/A Abdomen)     Patient location during evaluation: PACU Anesthesia Type: General Level of consciousness: awake and alert Pain management: pain level controlled Vital Signs Assessment: post-procedure vital signs reviewed and stable Respiratory status: spontaneous breathing, nonlabored ventilation, respiratory function stable and patient connected to nasal cannula oxygen Cardiovascular status: blood pressure returned to baseline and stable Postop Assessment: no apparent nausea or vomiting Anesthetic complications: no    Last Vitals:  Vitals:   03/31/17 1300 03/31/17 1331  BP: 111/67 120/72  Pulse: (!) 59 71  Resp: 12 16  Temp: 36.7 C 37.1 C  SpO2: 98% 100%               Effie Berkshire

## 2017-04-01 ENCOUNTER — Encounter (HOSPITAL_COMMUNITY): Payer: Self-pay | Admitting: General Surgery

## 2017-04-01 LAB — BASIC METABOLIC PANEL
ANION GAP: 10 (ref 5–15)
BUN: 13 mg/dL (ref 6–20)
CHLORIDE: 105 mmol/L (ref 101–111)
CO2: 23 mmol/L (ref 22–32)
Calcium: 9.4 mg/dL (ref 8.9–10.3)
Creatinine, Ser: 1.25 mg/dL — ABNORMAL HIGH (ref 0.61–1.24)
GFR calc Af Amer: >60 mL/min (ref >60)
GFR calc non Af Amer: >60 mL/min (ref >60)
GLUCOSE: 130 mg/dL — AB (ref 65–99)
Potassium: 4.2 mmol/L (ref 3.5–5.1)
Sodium: 138 mmol/L (ref 135–145)

## 2017-04-01 LAB — CBC
HEMATOCRIT: 40.6 % (ref 39.0–52.0)
HEMOGLOBIN: 13.4 g/dL (ref 13.0–17.0)
MCH: 27.9 pg (ref 26.0–34.0)
MCHC: 33 g/dL (ref 30.0–36.0)
MCV: 84.4 fL (ref 78.0–100.0)
Platelets: 276 10*3/uL (ref 150–400)
RBC: 4.81 MIL/uL (ref 4.22–5.81)
RDW: 13.3 % (ref 11.5–15.5)
WBC: 13.4 10*3/uL — AB (ref 4.0–10.5)

## 2017-04-01 MED ORDER — ROSUVASTATIN CALCIUM 20 MG PO TABS
40.0000 mg | ORAL_TABLET | Freq: Every day | ORAL | Status: DC
  Administered 2017-04-01 – 2017-04-02 (×2): 40 mg via ORAL
  Filled 2017-04-01 (×2): qty 2

## 2017-04-01 NOTE — Progress Notes (Signed)
1 Day Post-Op ileostomy reversal Subjective: Doing well, min pain, no flatus or BM yet  Objective: Vital signs in last 24 hours: Temp:  [97.5 F (36.4 C)-98.9 F (37.2 C)] 98.6 F (37 C) (03/28 0442) Pulse Rate:  [51-78] 65 (03/28 0442) Resp:  [11-23] 17 (03/28 0442) BP: (105-132)/(62-76) 126/62 (03/28 0442) SpO2:  [96 %-100 %] 97 % (03/28 0442) Weight:  [102.8 kg (226 lb 11.2 oz)] 102.8 kg (226 lb 11.2 oz) (03/28 0442)   Intake/Output from previous day: 03/27 0701 - 03/28 0700 In: 2427.5 [P.O.:480; I.V.:1897.5; IV Piggyback:50] Out: 2260 [Urine:2250; Blood:10] Intake/Output this shift: No intake/output data recorded.   General appearance: alert and cooperative GI: normal findings: soft, appropriately tender  Incision: no significant drainage  Lab Results:  Recent Labs    04/01/17 0421  WBC 13.4*  HGB 13.4  HCT 40.6  PLT 276   BMET Recent Labs    04/01/17 0421  NA 138  K 4.2  CL 105  CO2 23  GLUCOSE 130*  BUN 13  CREATININE 1.25*  CALCIUM 9.4   PT/INR No results for input(s): LABPROT, INR in the last 72 hours. ABG No results for input(s): PHART, HCO3 in the last 72 hours.  Invalid input(s): PCO2, PO2  MEDS, Scheduled . acetaminophen  1,000 mg Oral Q6H  . alvimopan  12 mg Oral BID  . enoxaparin (LOVENOX) injection  40 mg Subcutaneous Q24H  . ezetimibe  10 mg Oral Daily  . fenofibrate  160 mg Oral Q lunch  . rosuvastatin  40 mg Oral Q supper    Studies/Results: No results found.  Assessment: s/p Procedure(s): ILEOSTOMY REVERSAL ERAS PATHWAY Patient Active Problem List   Diagnosis Date Noted  . Genetic testing 03/09/2017  . Family history of colon cancer   . Family history of bladder cancer   . Rectal cancer ([place]) 12/31/2016    Expected post op course  Plan: Cont liquid diet today Decrease MIV Cont to ambulate.   Change dressing but leave wick in place Binder for comfort  LOS: 1 day     .Rosario Adie, [facility], [place] [phone]   04/01/2017 7:29 AM

## 2017-04-02 MED ORDER — LOPERAMIDE HCL 2 MG PO CAPS: 2.0000 mg | ORAL_CAPSULE | ORAL | Status: DC | PRN

## 2017-04-02 NOTE — Progress Notes (Signed)
2 Days Post-Op ileostomy reversal Subjective: Doing well, min pain, having BM's  Objective: Vital signs in last 24 hours: Temp:  [98 F (36.7 C)-98.6 F (37 C)] 98.4 F (36.9 C) (03/29 1000) Pulse Rate:  [50-65] 65 (03/29 1000) Resp:  [16] 16 (03/29 1000) BP: (122-131)/(69-78) 122/69 (03/29 1000) SpO2:  [98 %-100 %] 98 % (03/29 1000) Weight:  [100.6 kg (221 lb 12.5 oz)] 100.6 kg (221 lb 12.5 oz) (03/29 0610)   Intake/Output from previous day: 03/28 0701 - 03/29 0700 In: 1154 [P.O.:600; I.V.:554] Out: 3950 [Urine:3950] Intake/Output this shift: No intake/output data recorded.   General appearance: alert and cooperative GI: normal findings: soft, appropriately tender  Incision: no significant drainage, wick removed  Lab Results:  Recent Labs    04/01/17 0421  WBC 13.4*  HGB 13.4  HCT 40.6  PLT 276   BMET Recent Labs    04/01/17 0421  NA 138  K 4.2  CL 105  CO2 23  GLUCOSE 130*  BUN 13  CREATININE 1.25*  CALCIUM 9.4   PT/INR No results for input(s): LABPROT, INR in the last 72 hours. ABG No results for input(s): PHART, HCO3 in the last 72 hours.  Invalid input(s): PCO2, PO2  MEDS, Scheduled . acetaminophen  1,000 mg Oral Q6H  . enoxaparin (LOVENOX) injection  40 mg Subcutaneous Q24H  . ezetimibe  10 mg Oral Daily  . fenofibrate  160 mg Oral Q lunch  . rosuvastatin  40 mg Oral q1800    Studies/Results: No results found.  Assessment: s/p Procedure(s): ILEOSTOMY REVERSAL ERAS PATHWAY Patient Active Problem List   Diagnosis Date Noted  . Genetic testing 03/09/2017  . Family history of colon cancer   . Family history of bladder cancer   . Rectal cancer ([place]) 12/31/2016    Expected post op course  Plan: Reg diet today SL MIV Cont to ambulate.   Change dressing daily and PRN Binder for comfort Hopefully d/c tom    LOS: 2 days     .Rosario Adie, [facility], [place]   04/02/2017 11:08 AM

## 2017-04-02 NOTE — Progress Notes (Signed)
Patient has had multiple soft to liquid dark brown stools, Entereg is discontinued per order, denies abdominal pain, nausea and vomiting

## 2017-04-03 MED ORDER — ACETAMINOPHEN 325 MG PO TABS: 650.0000 mg | ORAL_TABLET | Freq: Four times a day (QID) | ORAL | Status: DC | PRN

## 2017-04-03 NOTE — Progress Notes (Signed)
Discharge education complete and patient verbalizes understanding of instructions and follow-up information.  Patient is stable and waiting for wife to arrive.

## 2017-04-03 NOTE — Discharge Summary (Signed)
Physician Discharge Summary  Patient ID: Lucas Morrison MRN: [id number] DOB/AGE: [birth year] 49 y.o.  Admit date: 03/31/2017 Discharge date: 04/03/2017  Admission Diagnoses: history of rectal cancer, loop ileostomy   Discharge Diagnoses:  Active Problems:   Rectal cancer [facility])   Discharged Condition: good  Hospital Course: He was admitted for routine post-operative care following reversal of loop ileostomy. His hospital course was uneventful. His vitals and labs were appropriate. On the day of discharge he was having bowel movements and flatus, tolerating a diet without nausea or abdominal pain, ambulating unassisted, and surgical pain was controlled with tylenol. He was deemed stable for discharge home.   Consults: None  Treatments: reversal of ileostomy  Discharge Exam: Blood pressure 114/63, pulse (!) 54, temperature 98 F (36.7 C), temperature source Oral, resp. rate 18, height 6\' 3"  (1.905 m), weight 102.3 kg (225 lb 8.5 oz), SpO2 99 %. Alert, cooperative Extraocular motions intact, anicteric Moist mucus membranes Unlabored respirations, clear bilaterally Reg rate and rhythm Abdomen soft, nondistended, appropriately minimally tender around wound which is clean and without surrounding erythema or induration Skin is warm and dry  Disposition: Discharge disposition: 01-Home or Self Care       Discharge Instructions    Increase activity slowly   Complete by:  As directed      Allergies as of 04/03/2017   No Known Allergies     Medication List    STOP taking these medications   nystatin cream Commonly known as:  MYCOSTATIN   nystatin powder Commonly known as:  MYCOSTATIN/NYSTOP     TAKE these medications   acetaminophen 325 MG tablet Commonly known as:  TYLENOL Take 2 tablets (650 mg total) by mouth every 6 (six) hours as needed for mild pain or moderate pain.   ezetimibe 10 MG tablet Commonly known as:  ZETIA Take 10 mg by mouth daily.   fenofibrate 160 MG  tablet Take 160 mg by mouth daily with lunch.   Fish Oil 1000 MG Caps Take 1,000 mg by mouth daily.   guaiFENesin 600 MG 12 hr tablet Commonly known as:  MUCINEX Take 600 mg by mouth daily as needed for cough.   loperamide 2 MG capsule Commonly known as:  IMODIUM Take 1 capsule (2 mg total) by mouth as needed for diarrhea or loose stools.   multivitamin with minerals Tabs tablet Take 1 tablet by mouth daily.   naproxen sodium 220 MG tablet Commonly known as:  ALEVE Take 440 mg by mouth daily as needed (pain).   rosuvastatin 40 MG tablet Commonly known as:  CRESTOR Take 40 mg by mouth daily with supper.   sildenafil 100 MG tablet Commonly known as:  VIAGRA Take 100 mg by mouth as needed for erectile dysfunction.      Follow-up Information    Leighton Ruff, MD. Schedule an appointment as soon as possible for a visit in 2 weeks.   Specialty:  General Surgery Contact information: [place] [address] [phone]           Signed: Clovis Riley 04/03/2017, 7:29 AM

## 2017-04-23 ENCOUNTER — Encounter (HOSPITAL_COMMUNITY): Payer: Self-pay | Admitting: *Deleted

## 2017-10-19 ENCOUNTER — Other Ambulatory Visit: Payer: Self-pay | Admitting: General Surgery

## 2017-10-19 DIAGNOSIS — Z85048 Personal history of other malignant neoplasm of rectum, rectosigmoid junction, and anus: Secondary | ICD-10-CM

## 2017-10-26 ENCOUNTER — Ambulatory Visit
Admission: RE | Admit: 2017-10-26 | Discharge: 2017-10-26 | Disposition: A | Discharge status: Home / Self Care | Payer: BLUE CROSS/BLUE SHIELD | Source: Ambulatory Visit | Attending: General Surgery | Admitting: General Surgery

## 2017-10-26 DIAGNOSIS — Z85048 Personal history of other malignant neoplasm of rectum, rectosigmoid junction, and anus: Secondary | ICD-10-CM

## 2017-10-26 MED ORDER — IOPAMIDOL (ISOVUE-300) INJECTION 61%
125.0000 mL | Freq: Once | INTRAVENOUS | Status: AC | PRN
  Administered 2017-10-26: 125 mL via INTRAVENOUS

## 2018-01-12 ENCOUNTER — Ambulatory Visit: Payer: Self-pay | Admitting: General Surgery

## 2018-01-12 NOTE — H&P (Signed)
History of Present Illness Leighton Ruff MD; 06/11/6193 4:25 PM) The patient is a 48 year old male who presents with colorectal cancer. 48 year old male who presented to the office for evaluation of a rectal cancer. He does have a family history of colon and rectal cancer. He had approximately 3-4 months of rectal bleeding. Colonoscopy showed a mid rectal mass. Biopsies confirmed adenocarcinoma. CT scans of the chest abdomen and pelvis were negative for any signs of metastatic disease. CEA was mildly elevated at 3.5. Ultrasound showed a T2 N0 lesion, which was tattooed. He then underwent a robotic-assisted low anterior resection with diverting ileostomy on January 29, 2017. He did well postoperatively and was discharged on postop day 4 in stable condition. He was taken back to the operating room for ostomy reversal approximately 2 months after his surgery. He is doing well. He is back to normal activities. He has typical low anterior syndrome symptoms with clustering of bowel movements. He denies any loose stools. He denies any difficulty with control. He denies any rectal bleeding. He does have some swelling of his abdominal wall over his ileostomy site. This gets worse with vigorous activities. It resolves when he lies down. He denies any pain in the area.   Problem List/Past Medical Leighton Ruff, MD; 0/09/3265 4:25 PM) HISTORY OF RECTAL CANCER (Z85.048) INCISIONAL HERNIA, WITHOUT OBSTRUCTION OR GANGRENE (K43.2)  Past Surgical History Leighton Ruff, MD; 01/06/4578 4:25 PM) Thyroid Surgery  Diagnostic Studies History Leighton Ruff, MD; 09/13/8336 4:25 PM) Colonoscopy within last year  Allergies Mammie Lorenzo, LPN; 02/09/537 7:67 PM) No Known Drug Allergies [01/07/2017]:  Medication History Mammie Lorenzo, LPN; 03/09/1935 9:02 PM) Probiotic (Oral) Active. Zetia (10MG  Tablet, Oral) Active. Fenofibrate (160MG  Tablet, Oral) Active. Rosuvastatin Calcium (40MG  Tablet,  Oral) Active. Multiple Vitamin (Oral) Active. Fish Oil (1000MG  Capsule, Oral) Active. Medications Reconciled  Social History Leighton Ruff, MD; 4/0/9735 4:25 PM) Alcohol use Occasional alcohol use. Caffeine use Coffee, Tea. Tobacco use Current every day smoker.  Family History Leighton Ruff, MD; 03/07/9922 4:25 PM) Colon Cancer Mother. Heart Disease Father. Heart disease in male family member before age 13 Respiratory Condition Father.  Other Problems Leighton Ruff, MD; 02/11/8339 4:25 PM) Gastroesophageal Reflux Disease Hypercholesterolemia Thyroid Disease     Review of Systems Leighton Ruff MD; 09/11/2227 4:25 PM) General Not Present- Appetite Loss, Chills, Fatigue, Fever, Night Sweats, Weight Gain and Weight Loss. Skin Not Present- Change in Wart/Mole, Dryness, Hives, Jaundice, New Lesions, Non-Healing Wounds, Rash and Ulcer. HEENT Not Present- Earache, Hearing Loss, Hoarseness, Nose Bleed, Oral Ulcers, Ringing in the Ears, Seasonal Allergies, Sinus Pain, Sore Throat, Visual Disturbances, Wears glasses/contact lenses and Yellow Eyes. Respiratory Present- Snoring. Not Present- Bloody sputum, Chronic Cough, Difficulty Breathing and Wheezing. Breast Not Present- Breast Mass, Breast Pain, Nipple Discharge and Skin Changes. Cardiovascular Not Present- Chest Pain, Difficulty Breathing Lying Down, Leg Cramps, Palpitations, Rapid Heart Rate, Shortness of Breath and Swelling of Extremities. Gastrointestinal Present- Bloody Stool. Not Present- Abdominal Pain, Bloating, Change in Bowel Habits, Chronic diarrhea, Constipation, Difficulty Swallowing, Excessive gas, Gets full quickly at meals, Hemorrhoids, Indigestion, Nausea, Rectal Pain and Vomiting. Male Genitourinary Not Present- Blood in Urine, Change in Urinary Stream, Frequency, Impotence, Nocturia, Painful Urination, Urgency and Urine Leakage. Musculoskeletal Not Present- Back Pain, Joint Pain, Joint Stiffness, Muscle Pain,  Muscle Weakness and Swelling of Extremities. Neurological Not Present- Decreased Memory, Fainting, Headaches, Numbness, Seizures, Tingling, Tremor, Trouble walking and Weakness. Psychiatric Not Present- Anxiety, Bipolar, Change in Sleep Pattern, Depression, Fearful and Frequent  crying. Endocrine Not Present- Cold Intolerance, Excessive Hunger, Hair Changes, Heat Intolerance and New Diabetes. Hematology Not Present- Blood Thinners, Easy Bruising, Excessive bleeding, Gland problems, HIV and Persistent Infections.  Vitals Claiborne Billings Dockery LPN; 0/07/6224 3:33 PM) 01/12/2018 4:10 PM Weight: 235.4 lb Height: 74in Body Surface Area: 2.33 m Body Mass Index: 30.22 kg/m  Temp.: 97.24F(Temporal)  Pulse: 92 (Regular)  BP: 122/76 (Sitting, Left Arm, Standard)      Physical Exam Leighton Ruff MD; 05/09/5623 4:26 PM)  General Mental Status-Alert. General Appearance-Not in acute distress. Build & Nutrition-Well nourished. Posture-Normal posture. Gait-Normal.  Head and Neck Head-normocephalic, atraumatic with no lesions or palpable masses. Trachea-midline.  Chest and Lung Exam Chest and lung exam reveals -on auscultation, normal breath sounds, no adventitious sounds and normal vocal resonance.  Cardiovascular Cardiovascular examination reveals -normal heart sounds, regular rate and rhythm with no murmurs and no digital clubbing, cyanosis, edema, increased warmth or tenderness.  Abdomen Inspection Inspection of the abdomen reveals - No Hernias. Palpation/Percussion Palpation and Percussion of the abdomen reveal - Soft, Non Tender, No Rigidity (guarding), No hepatosplenomegaly and No Palpable abdominal masses.  Rectal Anorectal Exam External - normal external exam. Internal - normal internal exam and normal sphincter tone.  Neurologic Neurologic evaluation reveals -alert and oriented x 3 with no impairment of recent or remote memory, normal attention span and  ability to concentrate, normal sensation and normal coordination.  Musculoskeletal Normal Exam - Bilateral-Upper Extremity Strength Normal and Lower Extremity Strength Normal.    Assessment & Plan Leighton Ruff MD; 06/08/8935 4:24 PM)  HISTORY OF RECTAL CANCER (Z85.048) Impression: Patient has no signs of recurrence of his stage II rectal cancer his [place] 1 year out from a low anterior resection. We will make a referral to [place] for follow-up colonoscopy Henrene Pastor). We will get a CEA level today.  Current Plans Follow up with [place] in the office in 6 MONTHS.   Call us sooner as needed.   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 I have recommended a left prostatic hernia repair with mesh. All questions were answered. Risks include bleeding, pain, recurrence and infection.

## 2018-02-16 NOTE — Patient Instructions (Addendum)
Lucas Morrison    Your procedure is scheduled on: 02-24-2018  Report to [name] Main  Entrance  Report to [name] at 530 AM     Call this number if you have problems the morning of surgery [phone]    Remember: Do not eat food or drink liquids :After Midnight. BRUSH YOUR TEETH MORNING OF SURGERY AND RINSE YOUR MOUTH OUT, NO CHEWING GUM CANDY OR MINTS.     Take these medicines the morning of surgery with A SIP OF WATER: ezetimibe (zetia)                               You may not have any metal on your body including hair pins and              piercings  Do not wear jewelry, make-up, lotions, powders or perfumes, deodorant             Do not wear nail polish.  Do not shave  48 hours prior to surgery.              Men may shave face and neck.   Do not bring valuables to the hospital. [name].  Contacts, dentures or bridgework may not be worn into surgery.  Leave suitcase in the car. After surgery it may be brought to your room.     Patients discharged the day of surgery will not be allowed to drive home. IF YOU ARE HAVING SURGERY AND GOING HOME THE SAME DAY, YOU MUST HAVE AN ADULT TO DRIVE YOU HOME AND BE WITH YOU FOR 24 HOURS. YOU MAY GO HOME BY TAXI OR UBER OR ORTHERWISE, BUT AN ADULT MUST ACCOMPANY YOU HOME AND STAY WITH YOU FOR 24 HOURS.  Name and phone number of your driver: Lucas Morrison CELL [phone]  Special Instructions: N/A              Please read over the following fact sheets you were given: _____________________________________________________________________             [name] - Preparing for Surgery Before surgery, you can play an important role.  Because skin is not sterile, your skin needs to be as free of germs as possible.  You can reduce the number of germs on your skin by washing with CHG (chlorahexidine gluconate) soap before surgery.  CHG is an antiseptic cleaner which  kills germs and bonds with the skin to continue killing germs even after washing. Please DO NOT use if you have an allergy to CHG or antibacterial soaps.  If your skin becomes reddened/irritated stop using the CHG and inform your nurse when you arrive at Short Stay. Do not shave (including legs and underarms) for at least 48 hours prior to the first CHG shower.  You may shave your face/neck. Please follow these instructions carefully:  1.  Shower with CHG Soap the night before surgery and the  morning of Surgery.  2.  If you choose to wash your hair, wash your hair first as usual with your  normal  shampoo.  3.  After you shampoo, rinse your hair and body thoroughly to remove the  shampoo.  4.  Use CHG as you would any other liquid soap.  You can apply chg directly  to the skin and wash                       Gently with a scrungie or clean washcloth.  5.  Apply the CHG Soap to your body ONLY FROM THE NECK DOWN.   Do not use on face/ open                           Wound or open sores. Avoid contact with eyes, ears mouth and genitals (private parts).                       Wash face,  Genitals (private parts) with your normal soap.             6.  Wash thoroughly, paying special attention to the area where your surgery  will be performed.  7.  Thoroughly rinse your body with warm water from the neck down.  8.  DO NOT shower/wash with your normal soap after using and rinsing off  the CHG Soap.                9.  Pat yourself dry with a clean towel.            10.  Wear clean pajamas.            11.  Place clean sheets on your bed the night of your first shower and do not  sleep with pets. Day of Surgery : Do not apply any lotions/deodorants the morning of surgery.  Please wear clean clothes to the hospital/surgery center.  FAILURE TO FOLLOW THESE INSTRUCTIONS MAY RESULT IN THE CANCELLATION OF YOUR SURGERY PATIENT SIGNATURE_________________________________  NURSE  SIGNATURE__________________________________  ________________________________________________________________________

## 2018-02-16 NOTE — Progress Notes (Signed)
Chest ct 10-26-17 epic

## 2018-02-21 ENCOUNTER — Encounter (HOSPITAL_COMMUNITY): Payer: Self-pay

## 2018-02-21 ENCOUNTER — Other Ambulatory Visit: Payer: Self-pay

## 2018-02-21 ENCOUNTER — Encounter (HOSPITAL_COMMUNITY)
Admission: RE | Admit: 2018-02-21 | Discharge: 2018-02-21 | Disposition: A | Discharge status: Home / Self Care | Payer: BLUE CROSS/BLUE SHIELD | Source: Ambulatory Visit | Attending: General Surgery | Admitting: General Surgery

## 2018-02-21 DIAGNOSIS — Z01812 Encounter for preprocedural laboratory examination: Secondary | ICD-10-CM | POA: Insufficient documentation

## 2018-02-21 HISTORY — DX: Gastro-esophageal reflux disease without esophagitis: K21.9

## 2018-02-21 LAB — CBC
HCT: 47.1 % (ref 39.0–52.0)
Hemoglobin: 15.2 g/dL (ref 13.0–17.0)
MCH: 28.7 pg (ref 26.0–34.0)
MCHC: 32.3 g/dL (ref 30.0–36.0)
MCV: 89 fL (ref 80.0–100.0)
Platelets: 257 10*3/uL (ref 150–400)
RBC: 5.29 MIL/uL (ref 4.22–5.81)
RDW: 12.9 % (ref 11.5–15.5)
WBC: 6 10*3/uL (ref 4.0–10.5)
nRBC: 0 % (ref 0.0–0.2)

## 2018-02-23 ENCOUNTER — Encounter: Payer: Self-pay | Admitting: Internal Medicine

## 2018-02-23 MED ORDER — BUPIVACAINE LIPOSOME 1.3 % IJ SUSP
20.0000 mL | INTRAMUSCULAR | Status: DC
  Filled 2018-02-23: qty 20

## 2018-02-23 NOTE — Anesthesia Preprocedure Evaluation (Addendum)
Anesthesia Evaluation  Patient identified by MRN, date of birth, ID band Patient awake    Reviewed: Allergy & Precautions, NPO status , Patient's Chart, lab work & pertinent test results  Airway Mallampati: II  TM Distance: >3 FB Neck ROM: Full    Dental  (+) Dental Advisory Given   Pulmonary Current Smoker,    breath sounds clear to auscultation       Cardiovascular negative cardio ROS   Rhythm:Regular Rate:Normal     Neuro/Psych negative neurological ROS     GI/Hepatic Neg liver ROS, GERD  ,Hx rectal CA s/p resection   Endo/Other  S/p left parathyroidectomy  Renal/GU negative Renal ROS     Musculoskeletal  (+) Arthritis ,   Abdominal   Peds  Hematology negative hematology ROS (+)   Anesthesia Other Findings   Reproductive/Obstetrics                            Lab Results  Component Value Date   WBC 6.0 02/21/2018   HGB 15.2 02/21/2018   HCT 47.1 02/21/2018   MCV 89.0 02/21/2018   PLT 257 02/21/2018    Anesthesia Physical Anesthesia Plan  ASA: II  Anesthesia Plan: General   Post-op Pain Management:    Induction: Intravenous  PONV Risk Score and Plan: 2 and Dexamethasone, Ondansetron and Treatment may vary due to age or medical condition  Airway Management Planned: Oral ETT  Additional Equipment:   Intra-op Plan:   Post-operative Plan: Extubation in OR  Informed Consent: I have reviewed the patients History and Physical, chart, labs and discussed the procedure including the risks, benefits and alternatives for the proposed anesthesia with the patient or authorized representative who has indicated his/her understanding and acceptance.     Dental advisory given  Plan Discussed with: CRNA  Anesthesia Plan Comments:        Anesthesia Quick Evaluation

## 2018-02-24 ENCOUNTER — Encounter (HOSPITAL_COMMUNITY)
Admission: RE | Disposition: A | Discharge status: Home / Self Care | Payer: Self-pay | Source: Other Acute Inpatient Hospital | Attending: General Surgery

## 2018-02-24 ENCOUNTER — Ambulatory Visit (HOSPITAL_COMMUNITY): Payer: BLUE CROSS/BLUE SHIELD | Admitting: Anesthesiology

## 2018-02-24 ENCOUNTER — Ambulatory Visit (HOSPITAL_COMMUNITY): Payer: BLUE CROSS/BLUE SHIELD | Admitting: Physician Assistant

## 2018-02-24 ENCOUNTER — Encounter (HOSPITAL_COMMUNITY): Payer: Self-pay

## 2018-02-24 ENCOUNTER — Ambulatory Visit (HOSPITAL_COMMUNITY)
Admission: RE | Admit: 2018-02-24 | Discharge: 2018-02-24 | Disposition: A | Discharge status: Home / Self Care | Payer: BLUE CROSS/BLUE SHIELD | Source: Other Acute Inpatient Hospital | Attending: General Surgery | Admitting: General Surgery

## 2018-02-24 DIAGNOSIS — Z800 Family history of malignant neoplasm of digestive organs: Secondary | ICD-10-CM | POA: Insufficient documentation

## 2018-02-24 DIAGNOSIS — Z932 Ileostomy status: Secondary | ICD-10-CM | POA: Insufficient documentation

## 2018-02-24 DIAGNOSIS — Z85048 Personal history of other malignant neoplasm of rectum, rectosigmoid junction, and anus: Secondary | ICD-10-CM | POA: Insufficient documentation

## 2018-02-24 DIAGNOSIS — K432 Incisional hernia without obstruction or gangrene: Secondary | ICD-10-CM | POA: Diagnosis present

## 2018-02-24 DIAGNOSIS — K219 Gastro-esophageal reflux disease without esophagitis: Secondary | ICD-10-CM | POA: Insufficient documentation

## 2018-02-24 DIAGNOSIS — F17200 Nicotine dependence, unspecified, uncomplicated: Secondary | ICD-10-CM | POA: Diagnosis not present

## 2018-02-24 DIAGNOSIS — E7800 Pure hypercholesterolemia, unspecified: Secondary | ICD-10-CM | POA: Diagnosis not present

## 2018-02-24 DIAGNOSIS — E079 Disorder of thyroid, unspecified: Secondary | ICD-10-CM | POA: Diagnosis not present

## 2018-02-24 HISTORY — PX: INCISIONAL HERNIA REPAIR: SHX193

## 2018-02-24 SURGERY — REPAIR, HERNIA, INCISIONAL, LAPAROSCOPIC
Anesthesia: General | Site: Abdomen

## 2018-02-24 MED ORDER — PROPOFOL 10 MG/ML IV BOLUS
INTRAVENOUS | Status: AC
  Filled 2018-02-24: qty 20

## 2018-02-24 MED ORDER — ROCURONIUM BROMIDE 100 MG/10ML IV SOLN
INTRAVENOUS | Status: AC
  Filled 2018-02-24: qty 1

## 2018-02-24 MED ORDER — SUGAMMADEX SODIUM 200 MG/2ML IV SOLN
INTRAVENOUS | Status: DC | PRN
  Administered 2018-02-24: 400 mg via INTRAVENOUS

## 2018-02-24 MED ORDER — BUPIVACAINE LIPOSOME 1.3 % IJ SUSP
INTRAMUSCULAR | Status: DC | PRN
  Administered 2018-02-24: 20 mL

## 2018-02-24 MED ORDER — LIDOCAINE 2% (20 MG/ML) 5 ML SYRINGE
INTRAMUSCULAR | Status: AC
  Filled 2018-02-24: qty 5

## 2018-02-24 MED ORDER — ACETAMINOPHEN 500 MG PO TABS
1000.0000 mg | ORAL_TABLET | ORAL | Status: AC
  Administered 2018-02-24: 1000 mg via ORAL
  Filled 2018-02-24: qty 2

## 2018-02-24 MED ORDER — LIDOCAINE 2% (20 MG/ML) 5 ML SYRINGE
INTRAMUSCULAR | Status: DC | PRN
  Administered 2018-02-24: 80 mg via INTRAVENOUS

## 2018-02-24 MED ORDER — FENTANYL CITRATE (PF) 250 MCG/5ML IJ SOLN
INTRAMUSCULAR | Status: AC
  Filled 2018-02-24: qty 5

## 2018-02-24 MED ORDER — OXYCODONE HCL 5 MG PO TABS: 5.0000 mg | ORAL_TABLET | ORAL | 0 refills | Status: DC | PRN

## 2018-02-24 MED ORDER — GABAPENTIN 300 MG PO CAPS
300.0000 mg | ORAL_CAPSULE | ORAL | Status: AC
  Administered 2018-02-24: 300 mg via ORAL
  Filled 2018-02-24: qty 1

## 2018-02-24 MED ORDER — ONDANSETRON HCL 4 MG/2ML IJ SOLN
INTRAMUSCULAR | Status: AC
  Filled 2018-02-24: qty 2

## 2018-02-24 MED ORDER — FENTANYL CITRATE (PF) 250 MCG/5ML IJ SOLN
INTRAMUSCULAR | Status: DC | PRN
  Administered 2018-02-24: 50 ug via INTRAVENOUS
  Administered 2018-02-24: 100 ug via INTRAVENOUS
  Administered 2018-02-24 (×2): 50 ug via INTRAVENOUS

## 2018-02-24 MED ORDER — SUGAMMADEX SODIUM 200 MG/2ML IV SOLN
INTRAVENOUS | Status: AC
  Filled 2018-02-24: qty 2

## 2018-02-24 MED ORDER — MIDAZOLAM HCL 5 MG/5ML IJ SOLN
INTRAMUSCULAR | Status: DC | PRN
  Administered 2018-02-24: 2 mg via INTRAVENOUS

## 2018-02-24 MED ORDER — BUPIVACAINE HCL (PF) 0.25 % IJ SOLN
INTRAMUSCULAR | Status: DC | PRN
  Administered 2018-02-24: 30 mL

## 2018-02-24 MED ORDER — BUPIVACAINE-EPINEPHRINE 0.5% -1:200000 IJ SOLN
INTRAMUSCULAR | Status: DC | PRN
  Administered 2018-02-24: 11 mL

## 2018-02-24 MED ORDER — CEFAZOLIN SODIUM-DEXTROSE 2-4 GM/100ML-% IV SOLN
2.0000 g | INTRAVENOUS | Status: AC
  Administered 2018-02-24: 2 g via INTRAVENOUS
  Filled 2018-02-24: qty 100

## 2018-02-24 MED ORDER — ONDANSETRON HCL 4 MG/2ML IJ SOLN
INTRAMUSCULAR | Status: DC | PRN
  Administered 2018-02-24: 4 mg via INTRAVENOUS

## 2018-02-24 MED ORDER — DEXAMETHASONE SODIUM PHOSPHATE 10 MG/ML IJ SOLN
INTRAMUSCULAR | Status: DC | PRN
  Administered 2018-02-24: 10 mg via INTRAVENOUS

## 2018-02-24 MED ORDER — DEXAMETHASONE SODIUM PHOSPHATE 10 MG/ML IJ SOLN
INTRAMUSCULAR | Status: AC
  Filled 2018-02-24: qty 1

## 2018-02-24 MED ORDER — MIDAZOLAM HCL 2 MG/2ML IJ SOLN
INTRAMUSCULAR | Status: AC
  Filled 2018-02-24: qty 2

## 2018-02-24 MED ORDER — ROCURONIUM BROMIDE 10 MG/ML (PF) SYRINGE
PREFILLED_SYRINGE | INTRAVENOUS | Status: DC | PRN
  Administered 2018-02-24: 20 mg via INTRAVENOUS
  Administered 2018-02-24: 50 mg via INTRAVENOUS

## 2018-02-24 MED ORDER — EPHEDRINE SULFATE-NACL 50-0.9 MG/10ML-% IV SOSY
PREFILLED_SYRINGE | INTRAVENOUS | Status: DC | PRN
  Administered 2018-02-24: 10 mg via INTRAVENOUS

## 2018-02-24 MED ORDER — BUPIVACAINE HCL (PF) 0.25 % IJ SOLN
INTRAMUSCULAR | Status: AC
  Filled 2018-02-24: qty 30

## 2018-02-24 MED ORDER — LIDOCAINE 2% (20 MG/ML) 5 ML SYRINGE
INTRAMUSCULAR | Status: DC | PRN
  Administered 2018-02-24: 1.5 mg/kg/h via INTRAVENOUS

## 2018-02-24 MED ORDER — PHENYLEPHRINE 40 MCG/ML (10ML) SYRINGE FOR IV PUSH (FOR BLOOD PRESSURE SUPPORT)
PREFILLED_SYRINGE | INTRAVENOUS | Status: DC | PRN
  Administered 2018-02-24: 40 ug via INTRAVENOUS

## 2018-02-24 MED ORDER — LACTATED RINGERS IV SOLN
INTRAVENOUS | Status: DC
  Administered 2018-02-24 (×2): via INTRAVENOUS

## 2018-02-24 MED ORDER — PROPOFOL 10 MG/ML IV BOLUS
INTRAVENOUS | Status: DC | PRN
  Administered 2018-02-24: 150 mg via INTRAVENOUS

## 2018-02-24 MED ORDER — FENTANYL CITRATE (PF) 100 MCG/2ML IJ SOLN: 25.0000 ug | INTRAMUSCULAR | Status: DC | PRN

## 2018-02-24 MED ORDER — CELECOXIB 200 MG PO CAPS
200.0000 mg | ORAL_CAPSULE | ORAL | Status: AC
  Administered 2018-02-24: 200 mg via ORAL
  Filled 2018-02-24: qty 1

## 2018-02-24 MED ORDER — ONDANSETRON HCL 4 MG/2ML IJ SOLN: 4.0000 mg | Freq: Once | INTRAMUSCULAR | Status: DC | PRN

## 2018-02-24 SURGICAL SUPPLY — 36 items
BINDER ABDOMINAL 12 ML 46-62 (SOFTGOODS) ×2 IMPLANT
CHLORAPREP W/TINT 26ML (MISCELLANEOUS) ×2 IMPLANT
COVER WAND RF STERILE (DRAPES) ×2 IMPLANT
DECANTER SPIKE VIAL GLASS SM (MISCELLANEOUS) ×2 IMPLANT
DERMABOND ADVANCED (GAUZE/BANDAGES/DRESSINGS) ×1
DERMABOND ADVANCED .7 DNX12 (GAUZE/BANDAGES/DRESSINGS) ×1 IMPLANT
DEVICE SECURE STRAP 25 ABSORB (INSTRUMENTS) ×2 IMPLANT
DEVICE TROCAR PUNCTURE CLOSURE (ENDOMECHANICALS) ×2 IMPLANT
ELECT REM PT RETURN 15FT ADLT (MISCELLANEOUS) ×2 IMPLANT
GLOVE BIO SURGEON STRL SZ 6.5 (GLOVE) ×2 IMPLANT
GLOVE BIOGEL PI IND STRL 7.0 (GLOVE) ×1 IMPLANT
GLOVE BIOGEL PI INDICATOR 7.0 (GLOVE) ×1
GOWN STRL REUS W/TWL 2XL LVL3 (GOWN DISPOSABLE) ×2 IMPLANT
GOWN STRL REUS W/TWL XL LVL3 (GOWN DISPOSABLE) ×4 IMPLANT
IRRIG SUCT STRYKERFLOW 2 WTIP (MISCELLANEOUS) ×2
IRRIGATION SUCT STRKRFLW 2 WTP (MISCELLANEOUS) ×1 IMPLANT
KIT BASIN OR (CUSTOM PROCEDURE TRAY) ×2 IMPLANT
MARKER SKIN DUAL TIP RULER LAB (MISCELLANEOUS) ×2 IMPLANT
MESH VENTRALIGHT ST 6IN CRC (Mesh General) ×2 IMPLANT
NEEDLE INSUFFLATION 14GA 120MM (NEEDLE) ×2 IMPLANT
NEEDLE SPNL 22GX3.5 QUINCKE BK (NEEDLE) ×2 IMPLANT
PAD POSITIONING PINK XL (MISCELLANEOUS) ×2 IMPLANT
PROTECTOR NERVE ULNAR (MISCELLANEOUS) IMPLANT
SCISSORS LAP 5X35 DISP (ENDOMECHANICALS) ×2 IMPLANT
SET TUBE SMOKE EVAC HIGH FLOW (TUBING) ×2 IMPLANT
SLEEVE XCEL OPT CAN 5 100 (ENDOMECHANICALS) ×4 IMPLANT
SUT NOVA NAB DX-16 0-1 5-0 T12 (SUTURE) ×4 IMPLANT
SUT VIC AB 4-0 PS2 18 (SUTURE) ×2 IMPLANT
TOWEL OR 17X26 10 PK STRL BLUE (TOWEL DISPOSABLE) ×2 IMPLANT
TOWEL OR NON WOVEN STRL DISP B (DISPOSABLE) ×2 IMPLANT
TRAY FOLEY MTR SLVR 16FR STAT (SET/KITS/TRAYS/PACK) IMPLANT
TRAY LAPAROSCOPIC (CUSTOM PROCEDURE TRAY) ×2 IMPLANT
TROCAR BLADELESS OPT 5 100 (ENDOMECHANICALS) ×2 IMPLANT
TROCAR XCEL 12X100 BLDLESS (ENDOMECHANICALS) ×2 IMPLANT
TROCAR XCEL BLUNT TIP 100MML (ENDOMECHANICALS) IMPLANT
TROCAR XCEL NON-BLD 11X100MML (ENDOMECHANICALS) IMPLANT

## 2018-02-24 NOTE — Discharge Instructions (Addendum)
General Anesthesia, Adult, Care After °This sheet gives you information about how to care for yourself after your procedure. Your health care provider may also give you more specific instructions. If you have problems or questions, contact your health care provider. °What can I expect after the procedure? °After the procedure, the following side effects are common: °· Pain or discomfort at the IV site. °· Nausea. °· Vomiting. °· Sore throat. °· Trouble concentrating. °· Feeling cold or chills. °· Weak or tired. °· Sleepiness and fatigue. °· Soreness and body aches. These side effects can affect parts of the body that were not involved in surgery. °Follow these instructions at home: ° °For at least 24 hours after the procedure: °· Have a responsible adult stay with you. It is important to have someone help care for you until you are awake and alert. °· Rest as needed. °· Do not: °? Participate in activities in which you could fall or become injured. °? Drive. °? Use heavy machinery. °? Drink alcohol. °? Take sleeping pills or medicines that cause drowsiness. °? Make important decisions or sign legal documents. °? Take care of children on your own. °Eating and drinking °· Follow any instructions from your health care provider about eating or drinking restrictions. °· When you feel hungry, start by eating small amounts of foods that are soft and easy to digest (bland), such as toast. Gradually return to your regular diet. °· Drink enough fluid to keep your urine pale yellow. °· If you vomit, rehydrate by drinking water, juice, or clear broth. °General instructions °· If you have sleep apnea, surgery and certain medicines can increase your risk for breathing problems. Follow instructions from your health care provider about wearing your sleep device: °? Anytime you are sleeping, including during daytime naps. °? While taking prescription pain medicines, sleeping medicines, or medicines that make you drowsy. °· Return to  your normal activities as told by your health care provider. Ask your health care provider what activities are safe for you. °· Take over-the-counter and prescription medicines only as told by your health care provider. °· If you smoke, do not smoke without supervision. °· Keep all follow-up visits as told by your health care provider. This is important. °Contact a health care provider if: °· You have nausea or vomiting that does not get better with medicine. °· You cannot eat or drink without vomiting. °· You have pain that does not get better with medicine. °· You are unable to pass urine. °· You develop a skin rash. °· You have a fever. °· You have redness around your IV site that gets worse. °Get help right away if: °· You have difficulty breathing. °· You have chest pain. °· You have blood in your urine or stool, or you vomit blood. °Summary °· After the procedure, it is common to have a sore throat or nausea. It is also common to feel tired. °· Have a responsible adult stay with you for the first 24 hours after general anesthesia. It is important to have someone help care for you until you are awake and alert. °· When you feel hungry, start by eating small amounts of foods that are soft and easy to digest (bland), such as toast. Gradually return to your regular diet. °· Drink enough fluid to keep your urine pale yellow. °· Return to your normal activities as told by your health care provider. Ask your health care provider what activities are safe for you. °This information is not   intended to replace advice given to you by your health care provider. Make sure you discuss any questions you have with your health care provider. Document Released: 03/30/2000 Document Revised: 08/07/2016 Document Reviewed: 08/07/2016 Elsevier Interactive Patient Education  2019 [place]: POST OP INSTRUCTIONS  1. DIET: Follow a light bland diet the first 24 hours after arrival home, such as soup, liquids,  crackers, etc.  Be sure to include lots of fluids daily.  Avoid fast food or heavy meals as your are more likely to get nauseated.  Eat a low fat the next few days after surgery. 2. Take your usually prescribed home medications unless otherwise directed. 3. PAIN CONTROL: a. Pain is best controlled by a usual combination of three different methods TOGETHER: i. Ice/Heat ii. Over the counter pain medication iii. Prescription pain medication b. Most patients will experience some swelling and bruising around the hernia(s) such as the bellybutton, groins, or old incisions.  Ice packs or heating pads (30-60 minutes up to 6 times a day) will help. Use ice for the first few days to help decrease swelling and bruising, then switch to heat to help relax tight/sore spots and speed recovery.  Some people prefer to use ice alone, heat alone, alternating between ice & heat.  Experiment to what works for you.  Swelling and bruising can take several weeks to resolve.   c. It is helpful to take an over-the-counter pain medication regularly for the first few weeks.  Choose one of the following that works best for you: i. Naproxen (Aleve, etc)  Two 220mg  tabs twice a day ii. Ibuprofen (Advil, etc) Three 200mg  tabs four times a day (every meal & bedtime) d. A  prescription for pain medication should be given to you upon discharge.  Take your pain medication as prescribed.  i. If you are having problems/concerns with the prescription medicine (does not control pain, nausea, vomiting, rash, itching, etc), please call us [phone] to see if we need to switch you to a different pain medicine that will work better for you and/or control your side effect better. ii. If you need a refill on your pain medication, please contact your pharmacy.  They will contact our office to request authorization. Prescriptions will not be filled after 5 pm or on week-ends. 4. Avoid getting constipated.  Between the surgery and the pain  medications, it is common to experience some constipation.  Increasing fluid intake and taking a fiber supplement (such as Metamucil, Citrucel, FiberCon, MiraLax, etc) 1-2 times a day regularly will usually help prevent this problem from occurring.  A mild laxative (prune juice, Milk of Magnesia, MiraLax, etc) should be taken according to package directions if there are no bowel movements after 48 hours.   5. Wash / shower every day.  You may shower over the dressings as they are waterproof.   6. Remove your waterproof bandages 5 days after surgery.  You may leave the incision open to air.  You may replace a dressing/Band-Aid to cover the incision for comfort if you wish.  Continue to shower over incision(s) after the dressing is off.    7. ACTIVITIES as tolerated:   a. You may resume regular (light) daily activities beginning the next day--such as daily self-care, walking, climbing stairs--gradually increasing activities as tolerated.  If you can walk 30 minutes without difficulty, it is safe to try more intense activity such as jogging, treadmill, bicycling, low-impact aerobics, swimming, etc. b. Save the most intensive and  strenuous activity for last such as sit-ups, heavy lifting, contact sports, etc  Refrain from any heavy lifting or straining until you are off narcotics for pain control.   c. DO NOT PUSH THROUGH PAIN.  Let pain be your guide: If it hurts to do something, don't do it.  Pain is your body warning you to avoid that activity for another week until the pain goes down. d. You may drive when you are no longer taking prescription pain medication, you can comfortably wear a seatbelt, and you can safely maneuver your car and apply brakes. e. Dennis Bast may have sexual intercourse when it is comfortable.  8. FOLLOW UP in our office a. Please call [facility] at [phone] to set up an appointment to see your surgeon in the office for a follow-up appointment approximately 2-3 weeks after your  surgery. b. Make sure that you call for this appointment the day you arrive home to insure a convenient appointment time. 9.  IF YOU HAVE DISABILITY OR FAMILY LEAVE FORMS, BRING THEM TO THE OFFICE FOR PROCESSING.  DO NOT GIVE THEM TO YOUR DOCTOR.  WHEN TO CALL us [phone]: 1. Poor pain control 2. Reactions / problems with new medications (rash/itching, nausea, etc)  3. Fever over 101.5 F (38.5 C) 4. Inability to urinate 5. Nausea and/or vomiting 6. Worsening swelling or bruising 7. Continued bleeding from incision. 8. Increased pain, redness, or drainage from the incision   The clinic staff is available to answer your questions during regular business hours (8:30am-5pm).  Please dont hesitate to call and ask to speak to one of our nurses for clinical concerns.   If you have a medical emergency, go to the nearest emergency room or call 911.  A surgeon from [facility] is always on call at the hospitals in [facility], [place], [place], [place], [place]  [address] ?  [address] [address], [place], [place]   [address] MAIN: [phone] ? TOLL FREE: [phone] ? FAX: [phone] [email or web address]

## 2018-02-24 NOTE — Transfer of Care (Signed)
Immediate Anesthesia Transfer of Care Note  Patient: Lucas Morrison  Procedure(s) Performed: LAPAROSCOPIC INCISIONAL HERNIA REPAIR WITH MESH (N/A Abdomen)  Patient Location: PACU  Anesthesia Type:General  Level of Consciousness: awake, alert , oriented and patient cooperative  Airway & Oxygen Therapy: Patient Spontanous Breathing and Patient connected to face mask oxygen  Post-op Assessment: Report given to RN, Post -op Vital signs reviewed and stable and Patient moving all extremities  Post vital signs: Reviewed and stable  Last Vitals:  Vitals Value Taken Time  BP 132/82 02/24/2018  8:47 AM  Temp    Pulse 72 02/24/2018  8:49 AM  Resp 13 02/24/2018  8:49 AM  SpO2 100 % 02/24/2018  8:49 AM  Vitals shown include unvalidated device data.  Last Pain:  Vitals:   02/24/18 0620  TempSrc:   PainSc: 0-No pain         Complications: No apparent anesthesia complications

## 2018-02-24 NOTE — Op Note (Signed)
02/24/2018  8:38 AM  PATIENT:  Lucas Morrison  48 y.o. male  Patient Care Team: Samella Parr as PCP - General  PRE-OPERATIVE DIAGNOSIS:  INCISIONAL HERNIA  POST-OPERATIVE DIAGNOSIS:  incisional hernia   PROCEDURE:   LAPAROSCOPIC INCISIONAL HERNIA REPAIR WITH MESH    Surgeon(s): Leighton Ruff, MD  ASSISTANT: none   ANESTHESIA:   local and general  EBL: 70ml Total I/O In: 1000 [I.V.:1000] Out: 25 [Blood:25]  Delay start of Pharmacological VTE agent (>24hrs) due to surgical blood loss or risk of bleeding:  no  DRAINS: none   SPECIMEN:  No Specimen  DISPOSITION OF SPECIMEN:  N/A  COUNTS:  YES  PLAN OF CARE: Discharge to home after PACU  PATIENT DISPOSITION:  PACU - hemodynamically stable.  INDICATION: Pleasant patient has developed a ventral wall abdominal hernia.   Recommendation was made for surgical repair:  The anatomy & physiology of the abdominal wall was discussed. The pathophysiology of hernias was discussed. Natural history risks without surgery including progeressive enlargement, pain, incarceration & strangulation was discussed. Contributors to complications such as smoking, obesity, diabetes, prior surgery, etc were discussed.  I feel the risks of no intervention will lead to serious problems that outweigh the operative risks; therefore, I recommended surgery to reduce and repair the hernia. I explained laparoscopic techniques with possible need for an open approach. I noted the probable use of mesh to patch and/or buttress the hernia repair  Risks such as bleeding, infection, abscess, need for further treatment, heart attack, death, and other risks were discussed. I noted a good likelihood this will help address the problem. Goals of post-operative recovery were discussed as well. Possibility that this will not correct all symptoms was explained. I stressed the importance of low-impact activity, aggressive pain control, avoiding constipation, & not  pushing through pain to minimize risk of post-operative chronic pain or injury. Possibility of reherniation especially with smoking, obesity, diabetes, immunosuppression, and other health conditions was discussed. We will work to minimize complications.  An educational handout further explaining the pathology & treatment options was given as well. Questions were answered. The patient expresses understanding & wishes to proceed with surgery.   OR FINDINGS: 10cm (horizontal) x 6cm (vertical)   Type of repair - Laparoscopic underlay repair   Name of mesh - 15cm round Bard Ventralight dual sided (polypropylene / Seprafilm)  Size of mesh - Length 15 cm, Width 15 cm  Mesh overlap - 2.5 cm  Placement of mesh - Intraperitoneal underlay repair   DESCRIPTION:   Informed consent was confirmed. The patient underwent general anaesthesia without difficulty. The patient was positioned appropriately. VTE prevention in place. The patient's abdomen was clipped, prepped, & draped in a sterile fashion. Surgical timeout confirmed our plan.  The patient was positioned in reverse Trendelenburg. Abdominal entry was gained using cut down technique in the RLQ hernia after failed attempt in LUQ with Varies needle. Entry was clean. I induced carbon dioxide insufflation. Camera inspection revealed no injury at either spot. Extra ports were carefully placed under direct laparoscopic visualization.   I could see the hernia in the RLQ of the abdomen.    I did laparoscopic lysis of adhesions to expose the entire anterior abdominal wall.  I primarily used and focused cold scissors.    I made sure hemostasis was good.  I mapped out the region using a needle passer.   To ensure that I would have at least 2 cm radial coverage outside of the hernia defect,  I chose a 15 cm dual sided mesh.  I placed #1 Prolene stitches around its edge about every 5 cm = 4 total.  I rolled the mesh & placed into the peritoneal cavity through the 10  cm fascial defect.  I unrolled  the mesh and positioned it appropriately.  I closed the defect using the suture passer and #1 Novofil sutures (4 total).  I secured the mesh to cover up the hernia defect using a laparoscopic suture passer to pass the tails of the Prolene through the abdominal wall & tagged them with clamps.  I started out in four corners to make sure I had the mesh centered over the hernia defect appropriately, and then proceeded to work in quadrants.  We evacuated CO2 & desufflated the abdomen.  I tied the fascial stitches down.  I reinsufflated the abdomen.  The mesh provided at least 2.5 - 5 cm circumferential coverage around the entire region of hernia defects.   I tacked the edges & central part of the mesh to the peritoneum/posterior rectus fascia with SecureStrap absorbable tacks.   Hemostasis was excellent except for the upper suture site.  I injected this region with 0.5% Marcaine with epi and this stopped further bleeding.    I then placed a TAPP block using Experel under direct laparoscopic guidance from the subxiphoid region down to the posterior iliac crests in the lower flanks. I did reinspection. Hemostasis was good. Mesh laid well. Capnoperitoneum was evacuated. Ports were removed. The skin was closed with 4-0 Vicryl at the port sites and dermabond on the fascial stitch puncture sites.  The 40mm site was closed with a 2-0 Vicryl subcutaneous suture and a 4-0 Vicryl running subcuticular suture.  An abd binder was placed.  Patient is being extubated to go to the recovery room. I'm about discussed operative findings with the patient's family.

## 2018-02-24 NOTE — Anesthesia Procedure Notes (Signed)
Procedure Name: Intubation Date/Time: 02/24/2018 7:32 AM Performed by: Mitzie Na, CRNA Pre-anesthesia Checklist: Patient identified, Emergency Drugs available, Suction available, Patient being monitored and Timeout performed Patient Re-evaluated:Patient Re-evaluated prior to induction Oxygen Delivery Method: Circle system utilized Preoxygenation: Pre-oxygenation with 100% oxygen Induction Type: IV induction Ventilation: Mask ventilation without difficulty and Oral airway inserted - appropriate to patient size Laryngoscope Size: Mac and 4 Grade View: Grade I Tube type: Oral Tube size: 7.5 mm Number of attempts: 1 Airway Equipment and Method: Stylet Placement Confirmation: ETT inserted through vocal cords under direct vision,  CO2 detector and breath sounds checked- equal and bilateral Secured at: 24 cm Tube secured with: Tape Dental Injury: Teeth and Oropharynx as per pre-operative assessment

## 2018-02-24 NOTE — H&P (Signed)
The patient is a 48 year old male who presents with colorectal cancer. 48 year old male who presented to the office for evaluation of a rectal cancer. He does have a family history of colon and rectal cancer. He had approximately 3-4 months of rectal bleeding. Colonoscopy showed a mid rectal mass. Biopsies confirmed adenocarcinoma. CT scans of the chest abdomen and pelvis were negative for any signs of metastatic disease. CEA was mildly elevated at 3.5. Ultrasound showed a T2 N0 lesion, which was tattooed. He then underwent a robotic-assisted low anterior resection with diverting ileostomy on January 29, 2017. He did well postoperatively and was discharged on postop day 4 in stable condition. He was taken back to the operating room for ostomy reversal approximately 2 months after his surgery. He is doing well. He is back to normal activities. He has typical low anterior syndrome symptoms with clustering of bowel movements. He denies any loose stools. He denies any difficulty with control. He denies any rectal bleeding. He does have some swelling of his abdominal wall over his ileostomy site. This gets worse with vigorous activities. It resolves when he lies down. He denies any pain in the area.   Problem List/Past Medical Leighton Ruff, MD; 03/13/7562 4:25 PM) HISTORY OF RECTAL CANCER (Z85.048) INCISIONAL HERNIA, WITHOUT OBSTRUCTION OR GANGRENE (K43.2)  Past Surgical History Leighton Ruff, MD; 03/07/2949 4:25 PM) Thyroid Surgery  Diagnostic Studies History Leighton Ruff, MD; 08/12/4164 4:25 PM) Colonoscopy within last year  Allergies Mammie Lorenzo, LPN; 0/06/3014 0:10 PM) No Known Drug Allergies [01/07/2017]:  Medication History Mammie Lorenzo, LPN; 09/07/2353 7:32 PM) Probiotic (Oral) Active. Zetia (10MG  Tablet, Oral) Active. Fenofibrate (160MG  Tablet, Oral) Active. Rosuvastatin Calcium (40MG  Tablet, Oral) Active. Multiple Vitamin (Oral) Active. Fish Oil  (1000MG  Capsule, Oral) Active. Medications Reconciled  Social History Leighton Ruff, MD; 2/0/2542 4:25 PM) Alcohol use Occasional alcohol use. Caffeine use Coffee, Tea. Tobacco use Current every day smoker.  Family History Leighton Ruff, MD; 7/0/6237 4:25 PM) Colon Cancer Mother. Heart Disease Father. Heart disease in male family member before age 4 Respiratory Condition Father.  Other Problems Leighton Ruff, MD; 06/06/8313 4:25 PM) Gastroesophageal Reflux Disease Hypercholesterolemia Thyroid Disease     Review of Systems  General Not Present- Appetite Loss, Chills, Fatigue, Fever, Night Sweats, Weight Gain and Weight Loss. Skin Not Present- Change in Wart/Mole, Dryness, Hives, Jaundice, New Lesions, Non-Healing Wounds, Rash and Ulcer. HEENT Not Present- Earache, Hearing Loss, Hoarseness, Nose Bleed, Oral Ulcers, Ringing in the Ears, Seasonal Allergies, Sinus Pain, Sore Throat, Visual Disturbances, Wears glasses/contact lenses and Yellow Eyes. Respiratory Present- Snoring. Not Present- Bloody sputum, Chronic Cough, Difficulty Breathing and Wheezing. Breast Not Present- Breast Mass, Breast Pain, Nipple Discharge and Skin Changes. Cardiovascular Not Present- Chest Pain, Difficulty Breathing Lying Down, Leg Cramps, Palpitations, Rapid Heart Rate, Shortness of Breath and Swelling of Extremities. Gastrointestinal Present- Bloody Stool. Not Present- Abdominal Pain, Bloating, Change in Bowel Habits, Chronic diarrhea, Constipation, Difficulty Swallowing, Excessive gas, Gets full quickly at meals, Hemorrhoids, Indigestion, Nausea, Rectal Pain and Vomiting. Male Genitourinary Not Present- Blood in Urine, Change in Urinary Stream, Frequency, Impotence, Nocturia, Painful Urination, Urgency and Urine Leakage. Musculoskeletal Not Present- Back Pain, Joint Pain, Joint Stiffness, Muscle Pain, Muscle Weakness and Swelling of Extremities. Neurological Not Present- Decreased Memory,  Fainting, Headaches, Numbness, Seizures, Tingling, Tremor, Trouble walking and Weakness. Psychiatric Not Present- Anxiety, Bipolar, Change in Sleep Pattern, Depression, Fearful and Frequent crying. Endocrine Not Present- Cold Intolerance, Excessive Hunger, Hair Changes, Heat Intolerance and New Diabetes. Hematology Not  Present- Blood Thinners, Easy Bruising, Excessive bleeding, Gland problems, HIV and Persistent Infections.  BP 120/78   Pulse (!) 59   Temp 97.8 F (36.6 C) (Oral)   Resp 14   Ht 6\' 3"  (1.905 m)   Wt 105.7 kg   SpO2 98%   BMI 29.12 kg/m    Physical Exam   General Mental Status-Alert. General Appearance-Not in acute distress. Build & Nutrition-Well nourished. Posture-Normal posture. Gait-Normal.  Head and Neck Head-normocephalic, atraumatic with no lesions or palpable masses. Trachea-midline.  Chest and Lung Exam Chest and lung exam reveals -on auscultation, normal breath sounds, no adventitious sounds and normal vocal resonance.  Cardiovascular Cardiovascular examination reveals -normal heart sounds, regular rate and rhythm with no murmurs and no digital clubbing, cyanosis, edema, increased warmth or tenderness.  Abdomen Inspection Inspection of the abdomen reveals - No Hernias. Palpation/Percussion Palpation and Percussion of the abdomen reveal - Soft, Non Tender, No Rigidity (guarding), No hepatosplenomegaly and No Palpable abdominal masses.  Rectal Anorectal Exam External - normal external exam. Internal - normal internal exam and normal sphincter tone.  Neurologic Neurologic evaluation reveals -alert and oriented x 3 with no impairment of recent or remote memory, normal attention span and ability to concentrate, normal sensation and normal coordination.  Musculoskeletal Normal Exam - Bilateral-Upper Extremity Strength Normal and Lower Extremity Strength Normal.    Assessment & Plan   HISTORY OF RECTAL CANCER  (Z85.048) Impression: Patient has no signs of recurrence of his stage II rectal cancer he is 1 year out from a low anterior resection. We will make a referral to [facility] for follow-up colonoscopy Henrene Pastor). We will get a CEA level today.  Current Plans Follow up with [place] in the office in 6 MONTHS.   Call us sooner as needed.   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 I have recommended a left prostatic hernia repair with mesh. All questions were answered. Risks include bleeding, pain, recurrence and infection.

## 2018-02-24 NOTE — Anesthesia Postprocedure Evaluation (Signed)
Anesthesia Post Note  Patient: Lucas Morrison  Procedure(s) Performed: LAPAROSCOPIC INCISIONAL HERNIA REPAIR WITH MESH (N/A Abdomen)     Patient location during evaluation: PACU Anesthesia Type: General Level of consciousness: awake and alert Pain management: pain level controlled Vital Signs Assessment: post-procedure vital signs reviewed and stable Respiratory status: spontaneous breathing, nonlabored ventilation, respiratory function stable and patient connected to nasal cannula oxygen Cardiovascular status: blood pressure returned to baseline and stable Postop Assessment: no apparent nausea or vomiting Anesthetic complications: no    Last Vitals:  Vitals:   02/24/18 0915 02/24/18 0930  BP:    Pulse:    Resp: 12 12  Temp:  36.7 C  SpO2:      Last Pain:  Vitals:   02/24/18 0930  TempSrc:   PainSc: 0-No pain                 Tiajuana Amass

## 2018-02-25 ENCOUNTER — Encounter (HOSPITAL_COMMUNITY): Payer: Self-pay | Admitting: General Surgery

## 2018-03-08 ENCOUNTER — Encounter: Payer: Self-pay | Admitting: Internal Medicine

## 2018-03-15 ENCOUNTER — Ambulatory Visit (AMBULATORY_SURGERY_CENTER): Payer: Self-pay | Admitting: *Deleted

## 2018-03-15 ENCOUNTER — Telehealth: Payer: Self-pay | Admitting: *Deleted

## 2018-03-15 ENCOUNTER — Encounter: Payer: Self-pay | Admitting: Internal Medicine

## 2018-03-15 VITALS — Ht 75.0 in | Wt 236.0 lb

## 2018-03-15 DIAGNOSIS — C20 Malignant neoplasm of rectum: Secondary | ICD-10-CM

## 2018-03-15 MED ORDER — SUPREP BOWEL PREP KIT 17.5-3.13-1.6 GM/177ML PO SOLN: 1.0000 | Freq: Once | ORAL | 0 refills | Status: AC

## 2018-03-15 NOTE — Progress Notes (Signed)
No egg or soy allergy known to patient  No issues with past sedation with any surgeries  or procedures, no intubation problems  No diet pills per patient No home 02 use per patient  No blood thinners per patient  Pt denies issues with constipation  No A fib or A flutter  Telephone note to Rosanne Sack RN and Julieanne Cotton CMA to obtain surgical clearance for upcoming Colonoscopy.(lap hernia repair on 02/24/18) suprep 50.00 coupon given related to [employer]

## 2018-03-15 NOTE — Telephone Encounter (Signed)
Dr. Henrene Pastor please see note below from Santiago Glad, does pt need clearance from surgery? Please advise.

## 2018-03-15 NOTE — Telephone Encounter (Signed)
This patient was seen in [place] today. The patient is scheduled for a 1st year follow up ( for rectal cancer)  Colonoscopy on Tuesday, 03/29/18. Patient had a Laparoscopic hernia repair on 02/24/18. Patient seen in Dr Manon Hilding office on 03/07/2018 and was allowed to return to work. Please follow up with Dr Marcello Moores regarding clearance for upcoming procedure.

## 2018-03-19 NOTE — Telephone Encounter (Signed)
Patient OK for colonoscopy as scheduled.

## 2018-03-21 NOTE — Telephone Encounter (Signed)
Called the patient and left a voice mail, to inform that the patient is cleared for Colonoscopy as schedulede, per Dr Henrene Pastor .

## 2018-03-28 ENCOUNTER — Telehealth: Payer: Self-pay | Admitting: *Deleted

## 2018-03-28 NOTE — Telephone Encounter (Signed)
Covid-19 travel screening questions  Have you traveled in the last 14 days? no If yes where?  Do you now or have you had a fever in the last 14 days?no  Do you have any respiratory symptoms of shortness of breath or cough now or in the last 14 days? no  Do you have a medical history of Congestive Heart Failure?  Do you have a medical history of lung disease?  Do you have any family members or close contacts with diagnosed or suspected Covid-19? No  Pt was informed of the care partner waiting in the car during the procedure, pt understood and verbalized understanding.

## 2018-03-29 ENCOUNTER — Other Ambulatory Visit: Payer: Self-pay

## 2018-03-29 ENCOUNTER — Encounter: Payer: Self-pay | Admitting: Internal Medicine

## 2018-03-29 ENCOUNTER — Ambulatory Visit (AMBULATORY_SURGERY_CENTER): Payer: BLUE CROSS/BLUE SHIELD | Admitting: Internal Medicine

## 2018-03-29 VITALS — BP 107/64 | HR 46 | Temp 98.2°F | Resp 12 | Ht 75.0 in | Wt 236.0 lb

## 2018-03-29 DIAGNOSIS — Z85038 Personal history of other malignant neoplasm of large intestine: Secondary | ICD-10-CM | POA: Diagnosis present

## 2018-03-29 DIAGNOSIS — C20 Malignant neoplasm of rectum: Secondary | ICD-10-CM

## 2018-03-29 MED ORDER — SODIUM CHLORIDE 0.9 % IV SOLN: 500.0000 mL | Freq: Once | INTRAVENOUS | Status: DC

## 2018-03-29 NOTE — Op Note (Signed)
[facility] Patient Name: Lucas Morrison Procedure Date: 03/29/2018 8:13 AM MRN: [id number] Endoscopist: Docia Chuck. Henrene Pastor , MD Age: 48 Referring MD:  Date of Birth: 01-28-1970 Gender: Male Account #: [id number] Procedure:                Colonoscopy Indications:              High risk colon cancer surveillance: Personal                            history of colon cancer. Rectal cancer diagnosed                            December 2018. Robot-assisted low anterior                            resection January 2019. Ileostomy reversal March                            2019. Incisional hernia repair February 24, 2018.                            Now for first surveillance colonoscopy Medicines:                Monitored Anesthesia Care Procedure:                Pre-Anesthesia Assessment:                           - Prior to the procedure, a History and Physical                            was performed, and patient medications and                            allergies were reviewed. The patient's tolerance of                            previous anesthesia was also reviewed. The risks                            and benefits of the procedure and the sedation                            options and risks were discussed with the patient.                            All questions were answered, and informed consent                            was obtained. Prior Anticoagulants: The patient has                            taken no previous anticoagulant or antiplatelet  agents. ASA Grade Assessment: II - A patient with                            mild systemic disease. After reviewing the risks                            and benefits, the patient was deemed in                            satisfactory condition to undergo the procedure.                           After obtaining informed consent, the colonoscope                            was passed under direct vision.  Throughout the                            procedure, the patient's blood pressure, pulse, and                            oxygen saturations were monitored continuously. The                            Model CF-[id number]) scope was introduced                            through the anus and advanced to the the cecum,                            identified by appendiceal orifice and ileocecal                            valve. The ileocecal valve, appendiceal orifice,                            and rectum were photographed. The quality of the                            bowel preparation was excellent. The colonoscopy                            was performed without difficulty. The patient                            tolerated the procedure well. The bowel preparation                            used was SUPREP. Scope In: 8:25:04 AM Scope Out: 8:38:07 AM Scope Withdrawal Time: 0 hours 10 minutes 34 seconds  Total Procedure Duration: 0 hours 13 minutes 3 seconds  Findings:                 The rectal anastomosis was approximately 2 cm from  the anal verge and appeared healthy. Internal                            hemorrhoids were found on antegrade view. No                            retroflexion secondary to narrow rectal vault.                           The exam was otherwise without abnormality on                            direct views. No evidence for neoplasia. Complications:            No immediate complications. Estimated blood loss:                            None. Estimated Blood Loss:     Estimated blood loss: none. Impression:               - Internal hemorrhoids.                           - The examination was otherwise normal status post                            low anterior resection                           - No specimens collected. Recommendation:           - Repeat colonoscopy in 1 year for surveillance.                           - Patient has  a contact number available for                            emergencies. The signs and symptoms of potential                            delayed complications were discussed with the                            patient. Return to normal activities tomorrow.                            Written discharge instructions were provided to the                            patient.                           - Resume previous diet.                           - Continue present medications. Docia Chuck. Henrene Pastor, MD 03/29/2018 8:47:06 AM This report has been signed electronically.

## 2018-03-29 NOTE — Progress Notes (Signed)
Report to PACU, RN, vss, BBS= Clear.  

## 2018-03-29 NOTE — Patient Instructions (Signed)
YOU HAD AN ENDOSCOPIC PROCEDURE TODAY AT THE [facility]:   Refer to the procedure report that was given to you for any specific questions about what was found during the examination.  If the procedure report does not answer your questions, please call your gastroenterologist to clarify.  If you requested that your care partner not be given the details of your procedure findings, then the procedure report has been included in a sealed envelope for you to review at your convenience later.  YOU SHOULD EXPECT: Some feelings of bloating in the abdomen. Passage of more gas than usual.  Walking can help get rid of the air that was put into your GI tract during the procedure and reduce the bloating. If you had a lower endoscopy (such as a colonoscopy or flexible sigmoidoscopy) you may notice spotting of blood in your stool or on the toilet paper. If you underwent a bowel prep for your procedure, you may not have a normal bowel movement for a few days.  Please Note:  You might notice some irritation and congestion in your nose or some drainage.  This is from the oxygen used during your procedure.  There is no need for concern and it should clear up in a day or so.  SYMPTOMS TO REPORT IMMEDIATELY:   Following lower endoscopy (colonoscopy or flexible sigmoidoscopy):  Excessive amounts of blood in the stool  Significant tenderness or worsening of abdominal pains  Swelling of the abdomen that is new, acute  Fever of 100F or higher  For urgent or emergent issues, a gastroenterologist can be reached at any hour by calling [phone].   DIET:  We do recommend a small meal at first, but then you may proceed to your regular diet.  Drink plenty of fluids but you should avoid alcoholic beverages for 24 hours.  ACTIVITY:  You should plan to take it easy for the rest of today and you should NOT DRIVE or use heavy machinery until tomorrow (because of the sedation medicines used during the test).     FOLLOW UP: Our staff will call the number listed on your records the next business day following your procedure to check on you and address any questions or concerns that you may have regarding the information given to you following your procedure. If we do not reach you, we will leave a message.  However, if you are feeling well and you are not experiencing any problems, there is no need to return our call.  We will assume that you have returned to your regular daily activities without incident.  If any biopsies were taken you will be contacted by phone or by letter within the next 1-3 weeks.  Please call us at [phone] if you have not heard about the biopsies in 3 weeks.    SIGNATURES/CONFIDENTIALITY: You and/or your care partner have signed paperwork which will be entered into your electronic medical record.  These signatures attest to the fact that that the information above on your After Visit Summary has been reviewed and is understood.  Full responsibility of the confidentiality of this discharge information lies with you and/or your care-partner. 

## 2018-03-30 ENCOUNTER — Telehealth: Payer: Self-pay | Admitting: *Deleted

## 2018-03-30 NOTE — Telephone Encounter (Signed)
  Follow up Call-  Call back number 03/29/2018  Post procedure Call Back phone  # [phone]  Permission to leave phone message Yes     Patient questions:  Do you have a fever, pain , or abdominal swelling? No. Pain Score  0 *  Have you tolerated food without any problems? Yes.    Have you been able to return to your normal activities? Yes.    Do you have any questions about your discharge instructions: Diet   No. Medications  No. Follow up visit  No.  Do you have questions or concerns about your Care? No.  Actions: * If pain score is 4 or above: No action needed, pain <4.

## 2018-10-10 ENCOUNTER — Other Ambulatory Visit: Payer: Self-pay | Admitting: General Surgery

## 2018-10-10 DIAGNOSIS — Z85048 Personal history of other malignant neoplasm of rectum, rectosigmoid junction, and anus: Secondary | ICD-10-CM

## 2018-10-17 ENCOUNTER — Ambulatory Visit
Admission: RE | Admit: 2018-10-17 | Discharge: 2018-10-17 | Disposition: A | Discharge status: Home / Self Care | Payer: BC Managed Care – PPO | Source: Ambulatory Visit | Attending: General Surgery | Admitting: General Surgery

## 2018-10-17 ENCOUNTER — Other Ambulatory Visit: Payer: Self-pay

## 2018-10-17 ENCOUNTER — Ambulatory Visit
Admission: RE | Admit: 2018-10-17 | Discharge: 2018-10-17 | Disposition: A | Discharge status: Home / Self Care | Payer: BLUE CROSS/BLUE SHIELD | Source: Ambulatory Visit | Attending: General Surgery | Admitting: General Surgery

## 2018-10-17 DIAGNOSIS — Z85048 Personal history of other malignant neoplasm of rectum, rectosigmoid junction, and anus: Secondary | ICD-10-CM

## 2018-10-17 MED ORDER — IOPAMIDOL (ISOVUE-300) INJECTION 61%
125.0000 mL | Freq: Once | INTRAVENOUS | Status: AC | PRN
  Administered 2018-10-17: 125 mL via INTRAVENOUS

## 2018-11-13 IMAGING — CT CT CHEST W/ CM
3 series · 16 of 32 positions shown, 19 images · IV contrast (APPLIED)
Comparison: 12/25/2016

CLINICAL DATA: Right lower quadrant pain and palpable bulge for 1
month. Rectal carcinoma. Prior appendectomy. Smoker.

EXAM:
CT CHEST, ABDOMEN, AND PELVIS WITH CONTRAST
TECHNIQUE: Multidetector CT imaging of the chest, abdomen and pelvis was
performed following the standard protocol during bolus
administration of intravenous contrast.
CONTRAST:  125mL 09PNY4-ZJJ IOPAMIDOL (09PNY4-ZJJ) INJECTION 61%

[Series 2: chest/abd/pelvis w/cm · axial · 0.87mm/px · z∈[-687,-102]mm · 8 of 143 slices shown]
[im 13/143  soft-tissue]
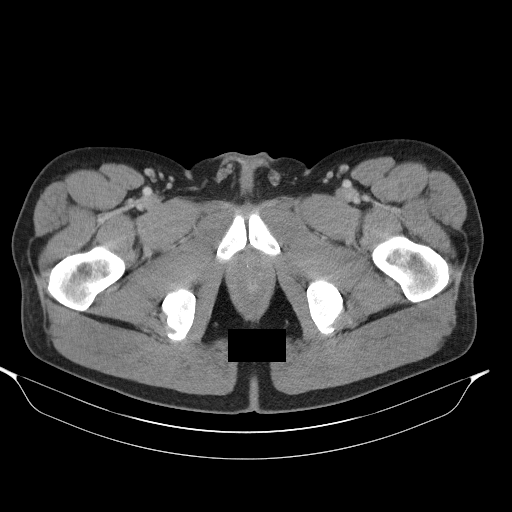
[im 26/143  soft-tissue]
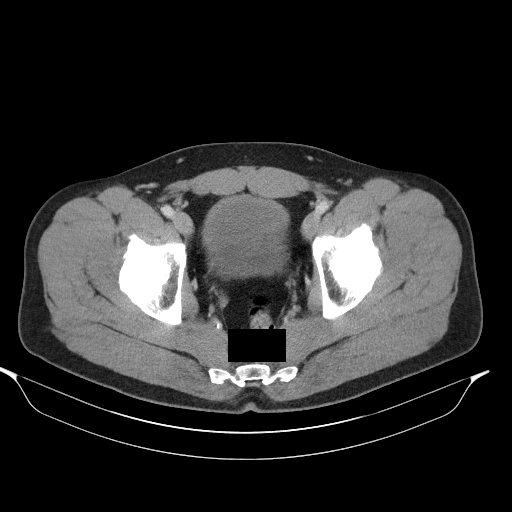
[im 52/143  soft-tissue]
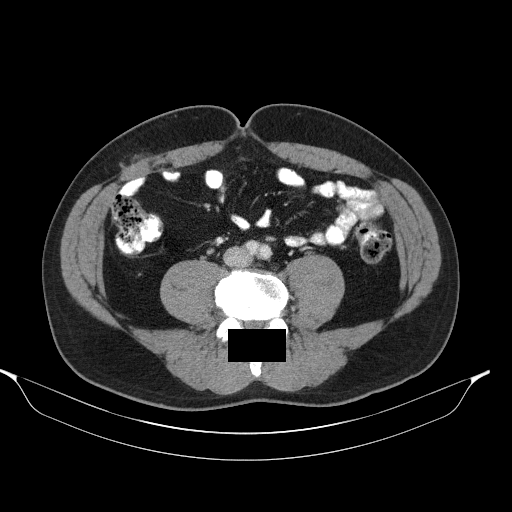
[im 65/143  soft-tissue]
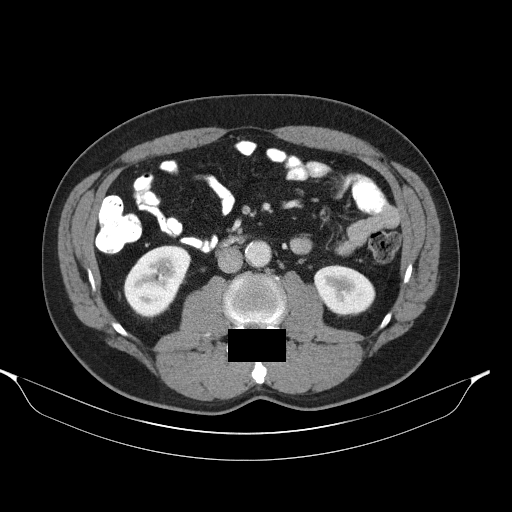
[im 78/143  soft-tissue]
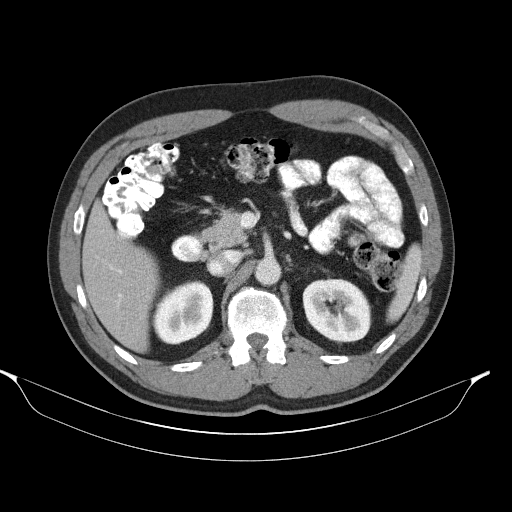
[im 91/143  soft-tissue]
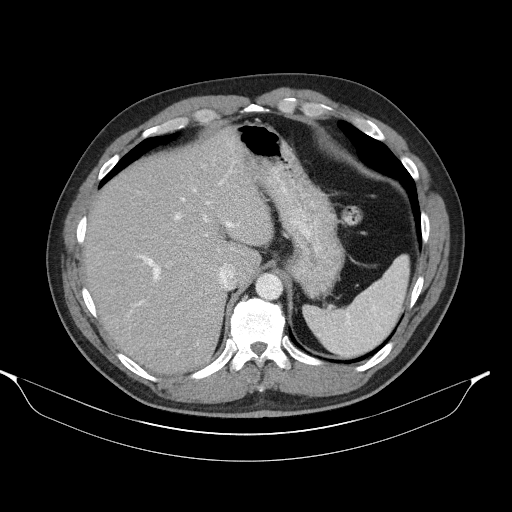
[im 117/143  soft-tissue]
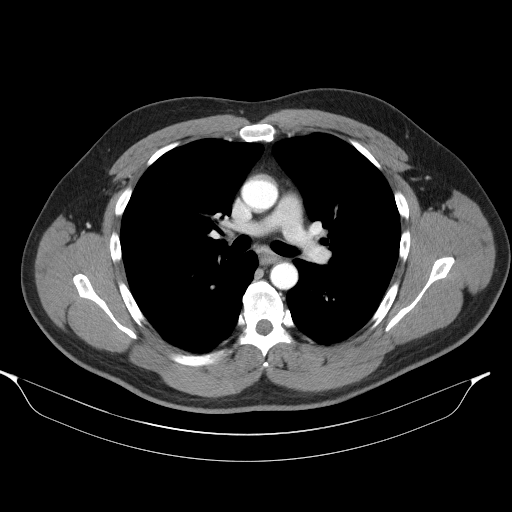
[im 130/143  soft-tissue]
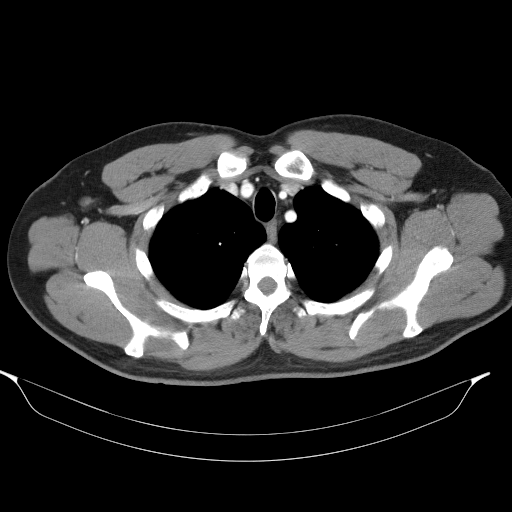

[Series 6: lung · axial · 0.85mm/px · z∈[-373,-157]mm · 6 of 180 slices shown]
[im 12/180  bone]
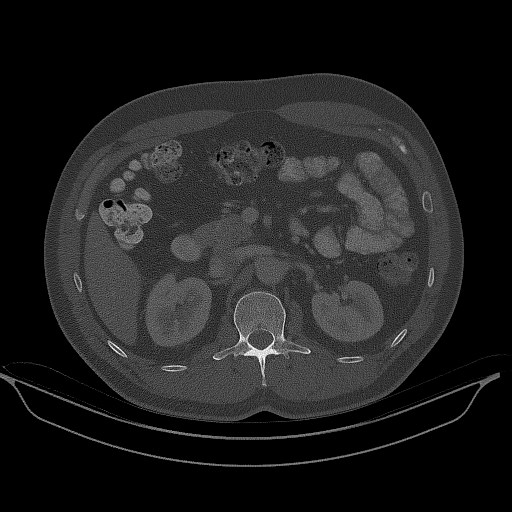
[im 36/180  bone]
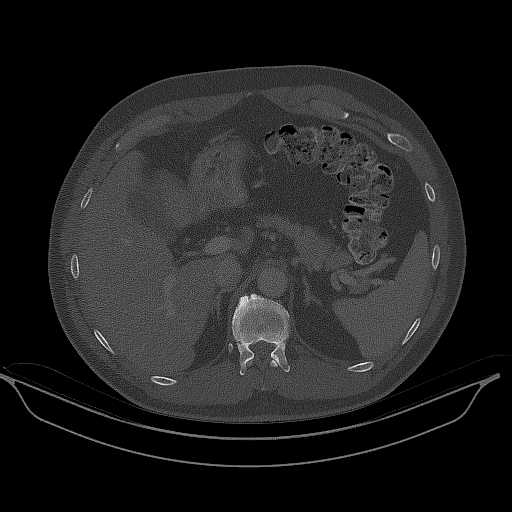
[im 60/180  bone]
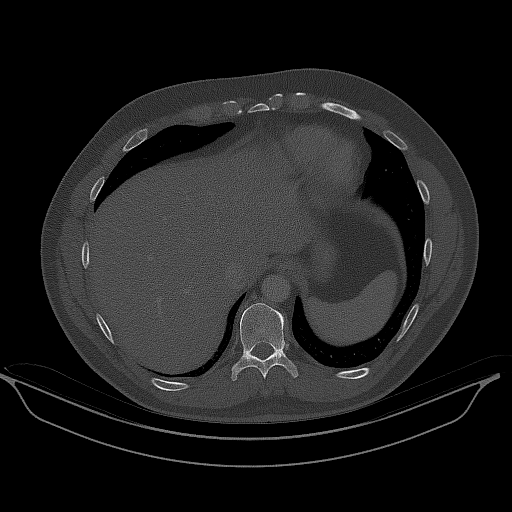
[im 84/180  bone]
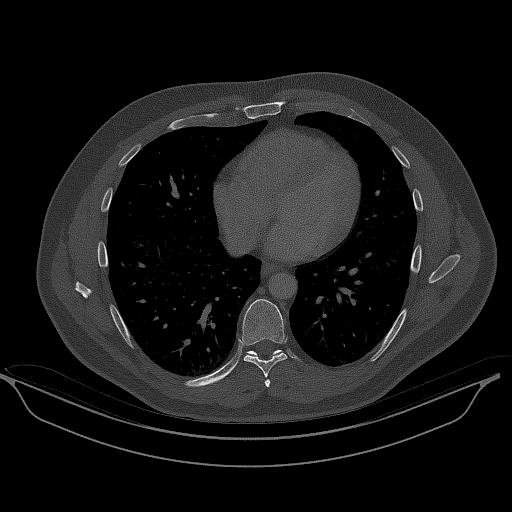
[im 96/180  bone]
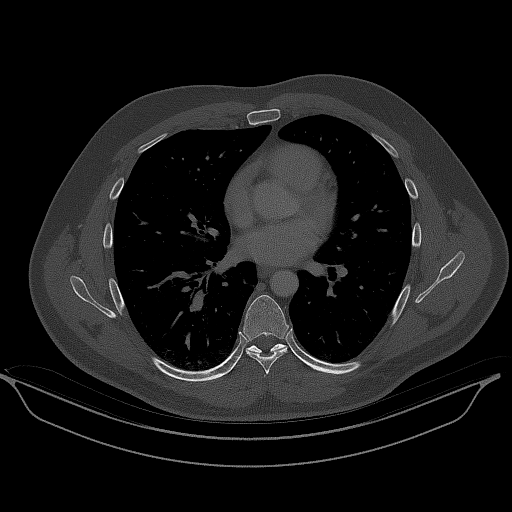
[im 120/180  bone]
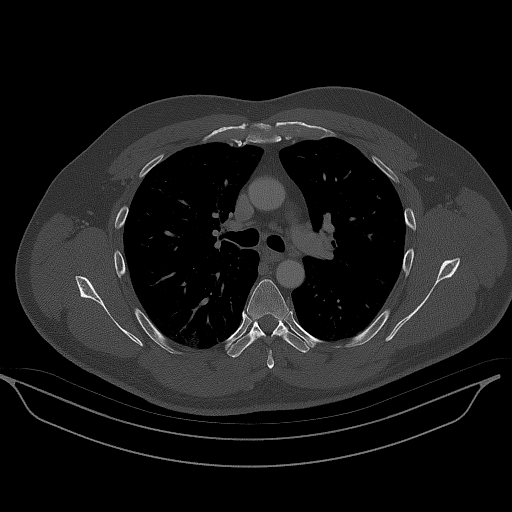

[Series 7: renal delay · axial · delayed · 0.87mm/px · z∈[-432,-367]mm · 2 of 40 slices shown, 5 images]
[im 14/40  soft-tissue]
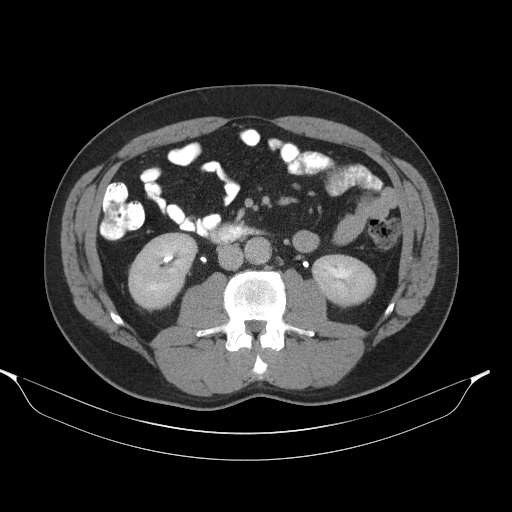
[im 14/40  lung]
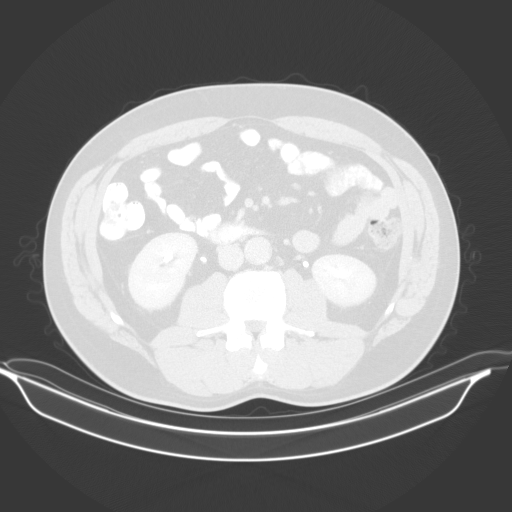
[im 14/40  bone]
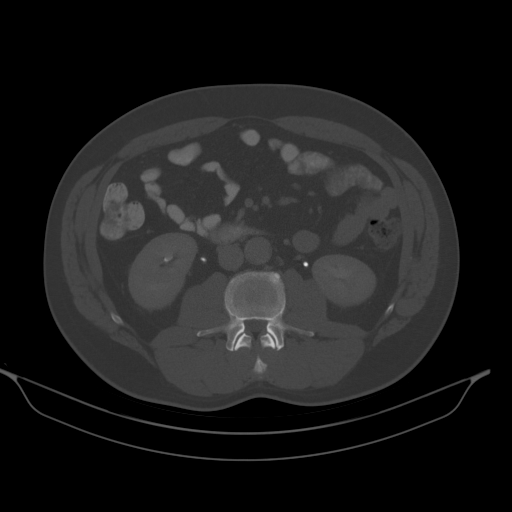
[im 27/40  soft-tissue]
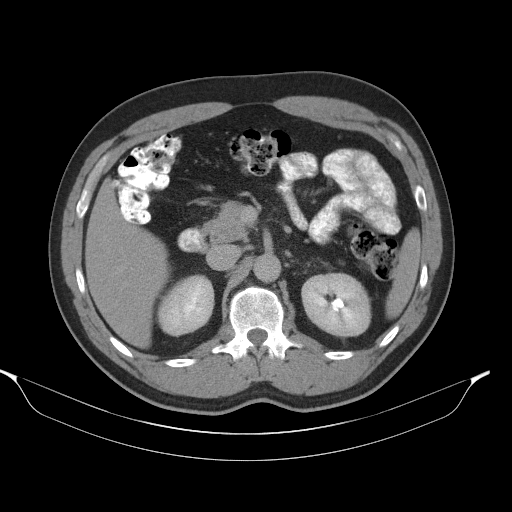
[im 27/40  lung]
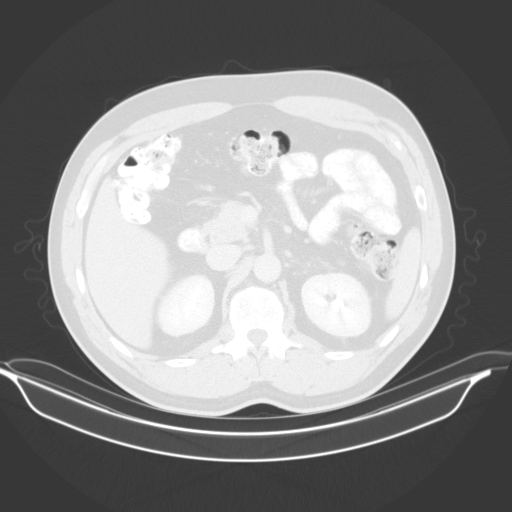

[16 of 32 positions shown; findings below may reference images not displayed]

FINDINGS: CT CHEST FINDINGS

Cardiovascular: No acute findings.

Mediastinum/Lymph Nodes: No masses or pathologically enlarged lymph
nodes identified.

Lungs/Pleura: No pulmonary infiltrate or mass identified. No
effusion present.

Musculoskeletal:  No suspicious bone lesions identified.

CT ABDOMEN AND PELVIS FINDINGS

Hepatobiliary: No masses identified. Gallbladder is unremarkable.

Pancreas:  No mass or inflammatory changes.

Spleen:  Within normal limits in size and appearance.

Adrenals/Urinary tract:  No masses or hydronephrosis.

Stomach/Bowel: No evidence of obstruction, inflammatory process, or
abnormal fluid collections.

Vascular/Lymphatic: No pathologically enlarged lymph nodes
identified. No abdominal aortic aneurysm. Aortic atherosclerosis.

Reproductive:  No mass or other significant abnormality identified.

Other: A small right anterior abdominal wall hernia is seen which
contains only fat. This is new compared to previous study. No
evidence of herniated bowel loops.

Musculoskeletal:  No suspicious bone lesions identified.
IMPRESSION: New small right anterior abdominal wall hernia containing only fat.

No evidence of recurrent or metastatic carcinoma.

## 2019-03-08 ENCOUNTER — Encounter: Payer: Self-pay | Admitting: Internal Medicine

## 2019-03-21 ENCOUNTER — Other Ambulatory Visit: Payer: Self-pay

## 2019-03-21 ENCOUNTER — Ambulatory Visit (AMBULATORY_SURGERY_CENTER): Payer: Self-pay | Admitting: *Deleted

## 2019-03-21 VITALS — Temp 97.6°F | Ht 75.0 in | Wt 238.0 lb

## 2019-03-21 DIAGNOSIS — Z01818 Encounter for other preprocedural examination: Secondary | ICD-10-CM

## 2019-03-21 DIAGNOSIS — C20 Malignant neoplasm of rectum: Secondary | ICD-10-CM

## 2019-03-21 MED ORDER — NA SULFATE-K SULFATE-MG SULF 17.5-3.13-1.6 GM/177ML PO SOLN: 1.0000 | Freq: Once | ORAL | 0 refills | Status: AC

## 2019-03-21 NOTE — Progress Notes (Signed)

## 2019-03-30 ENCOUNTER — Ambulatory Visit (INDEPENDENT_AMBULATORY_CARE_PROVIDER_SITE_OTHER): Payer: BC Managed Care – PPO

## 2019-03-30 ENCOUNTER — Other Ambulatory Visit: Payer: Self-pay | Admitting: Internal Medicine

## 2019-03-30 DIAGNOSIS — Z1159 Encounter for screening for other viral diseases: Secondary | ICD-10-CM

## 2019-03-30 LAB — SARS CORONAVIRUS 2 (TAT 6-24 HRS): SARS Coronavirus 2: NEGATIVE

## 2019-04-04 ENCOUNTER — Encounter: Payer: Self-pay | Admitting: Internal Medicine

## 2019-04-04 ENCOUNTER — Ambulatory Visit (AMBULATORY_SURGERY_CENTER): Payer: BC Managed Care – PPO | Admitting: Internal Medicine

## 2019-04-04 ENCOUNTER — Other Ambulatory Visit: Payer: Self-pay

## 2019-04-04 VITALS — BP 131/75 | HR 65 | Temp 96.9°F | Resp 16 | Ht 75.0 in | Wt 238.0 lb

## 2019-04-04 DIAGNOSIS — Z85038 Personal history of other malignant neoplasm of large intestine: Secondary | ICD-10-CM | POA: Diagnosis not present

## 2019-04-04 DIAGNOSIS — C20 Malignant neoplasm of rectum: Secondary | ICD-10-CM

## 2019-04-04 MED ORDER — SODIUM CHLORIDE 0.9 % IV SOLN: 500.0000 mL | INTRAVENOUS | Status: DC

## 2019-04-04 NOTE — Progress Notes (Signed)
PT taken to PACU. Monitors in place. VSS. Report given to RN. 

## 2019-04-04 NOTE — Op Note (Signed)
[facility] Patient Name: Lucas Morrison Procedure Date: 04/04/2019 3:14 PM MRN: [id number] Endoscopist: Docia Chuck. Henrene Pastor , MD Age: 49 Referring MD:  Date of Birth: 04-21-1970 Gender: Male Account #: [id number] Procedure:                Colonoscopy Indications:              High risk colon cancer surveillance: Personal                            history of colon cancer. Diagnosed with rectal                            cancer December 2018. Status post robot-assisted                            LAR January 2019. Colostomy reversal March 2019.                            Incisional hernia repair February 2020. For                            surveillance colonoscopy March 2020. Now for                            follow-up Medicines:                Monitored Anesthesia Care Procedure:                Pre-Anesthesia Assessment:                           - Prior to the procedure, a History and Physical                            was performed, and patient medications and                            allergies were reviewed. The patient's tolerance of                            previous anesthesia was also reviewed. The risks                            and benefits of the procedure and the sedation                            options and risks were discussed with the patient.                            All questions were answered, and informed consent                            was obtained. Prior Anticoagulants: The patient has  taken no previous anticoagulant or antiplatelet                            agents. ASA Grade Assessment: II - A patient with                            mild systemic disease. After reviewing the risks                            and benefits, the patient was deemed in                            satisfactory condition to undergo the procedure.                           After obtaining informed consent, the colonoscope   was passed under direct vision. Throughout the                            procedure, the patient's blood pressure, pulse, and                            oxygen saturations were monitored continuously. The                            Colonoscope was introduced through the anus and                            advanced to the the cecum, identified by                            appendiceal orifice and ileocecal valve. The                            ileocecal valve, appendiceal orifice, and rectum                            were photographed. The quality of the bowel                            preparation was excellent. The colonoscopy was                            performed without difficulty. The patient tolerated                            the procedure well. The bowel preparation used was                            SUPREP via split dose instruction. Scope In: 3:26:45 PM Scope Out: 3:35:05 PM Scope Withdrawal Time: 0 hours 6 minutes 42 seconds  Total Procedure Duration: 0 hours 8 minutes 20 seconds  Findings:                 The entire examined  colon appeared normal on direct                            and retroflexion views. An unremarkable surgical                            anastomosis was located approximately 2 cm proximal                            to the anal verge. Complications:            No immediate complications. Estimated blood loss:                            None. Estimated Blood Loss:     Estimated blood loss: none. Impression:               - The entire examined colon is normal on direct and                            retroflexion views. Status post LAR.                           - No specimens collected. Recommendation:           - Repeat colonoscopy in 1 year for surveillance.                           - Patient has a contact number available for                            emergencies. The signs and symptoms of potential                            delayed complications  were discussed with the                            patient. Return to normal activities tomorrow.                            Written discharge instructions were provided to the                            patient.                           - Resume previous diet.                           - Continue present medications. Docia Chuck. Henrene Pastor, MD 04/04/2019 3:39:34 PM This report has been signed electronically.

## 2019-04-04 NOTE — Patient Instructions (Signed)
YOU HAD AN ENDOSCOPIC PROCEDURE TODAY AT THE [facility]:   Refer to the procedure report that was given to you for any specific questions about what was found during the examination.  If the procedure report does not answer your questions, please call your gastroenterologist to clarify.  If you requested that your care partner not be given the details of your procedure findings, then the procedure report has been included in a sealed envelope for you to review at your convenience later.  YOU SHOULD EXPECT: Some feelings of bloating in the abdomen. Passage of more gas than usual.  Walking can help get rid of the air that was put into your GI tract during the procedure and reduce the bloating. If you had a lower endoscopy (such as a colonoscopy or flexible sigmoidoscopy) you may notice spotting of blood in your stool or on the toilet paper. If you underwent a bowel prep for your procedure, you may not have a normal bowel movement for a few days.  Please Note:  You might notice some irritation and congestion in your nose or some drainage.  This is from the oxygen used during your procedure.  There is no need for concern and it should clear up in a day or so.  SYMPTOMS TO REPORT IMMEDIATELY:   Following lower endoscopy (colonoscopy or flexible sigmoidoscopy):  Excessive amounts of blood in the stool  Significant tenderness or worsening of abdominal pains  Swelling of the abdomen that is new, acute  Fever of 100F or higher  For urgent or emergent issues, a gastroenterologist can be reached at any hour by calling [phone]. Do not use MyChart messaging for urgent concerns.    DIET:  We do recommend a small meal at first, but then you may proceed to your regular diet.  Drink plenty of fluids but you should avoid alcoholic beverages for 24 hours.  ACTIVITY:  You should plan to take it easy for the rest of today and you should NOT DRIVE or use heavy machinery until tomorrow (because  of the sedation medicines used during the test).    FOLLOW UP: Our staff will call the number listed on your records 48-72 hours following your procedure to check on you and address any questions or concerns that you may have regarding the information given to you following your procedure. If we do not reach you, we will leave a message.  We will attempt to reach you two times.  During this call, we will ask if you have developed any symptoms of COVID 19. If you develop any symptoms (ie: fever, flu-like symptoms, shortness of breath, cough etc.) before then, please call [phone].  If you test positive for Covid 19 in the 2 weeks post procedure, please call and report this information to us.    If any biopsies were taken you will be contacted by phone or by letter within the next 1-3 weeks.  Please call us at [phone] if you have not heard about the biopsies in 3 weeks.    SIGNATURES/CONFIDENTIALITY: You and/or your care partner have signed paperwork which will be entered into your electronic medical record.  These signatures attest to the fact that that the information above on your After Visit Summary has been reviewed and is understood.  Full responsibility of the confidentiality of this discharge information lies with you and/or your care-partner. 

## 2019-04-06 ENCOUNTER — Telehealth: Payer: Self-pay

## 2019-04-06 ENCOUNTER — Telehealth: Payer: Self-pay | Admitting: *Deleted

## 2019-04-06 NOTE — Telephone Encounter (Signed)
Message left

## 2019-04-06 NOTE — Telephone Encounter (Signed)
First attempt follow up call to pt, lm on vm 

## 2019-04-20 ENCOUNTER — Ambulatory Visit: Payer: BC Managed Care – PPO | Attending: Internal Medicine

## 2019-04-20 DIAGNOSIS — Z23 Encounter for immunization: Secondary | ICD-10-CM

## 2019-04-20 NOTE — Progress Notes (Signed)
   Covid-19 Vaccination Clinic  Name:  Lucas Morrison    MRN: [id number] DOB: 1970/06/27  04/20/2019  Mr. Laskin was observed post Covid-19 immunization for 15 minutes without incident. He was provided with Vaccine Information Sheet and instruction to access the V-Safe system.   Mr. Sedivy was instructed to call 911 with any severe reactions post vaccine: Marland Kitchen Difficulty breathing  . Swelling of face and throat  . A fast heartbeat  . A bad rash all over body  . Dizziness and weakness   Immunizations Administered    Name Date Dose VIS Date Route   Pfizer COVID-19 Vaccine 04/20/2019  4:39 PM 0.3 mL 12/16/2018 Intramuscular   Manufacturer: [place]   Lot: [id number]   [place]: [id number]

## 2019-05-15 ENCOUNTER — Ambulatory Visit: Payer: BC Managed Care – PPO

## 2019-05-22 ENCOUNTER — Ambulatory Visit: Payer: BC Managed Care – PPO | Attending: Internal Medicine

## 2019-05-22 DIAGNOSIS — Z23 Encounter for immunization: Secondary | ICD-10-CM

## 2019-05-22 NOTE — Progress Notes (Signed)
   Covid-19 Vaccination Clinic  Name:  Lucas Morrison    MRN: [id number] DOB: 1970/11/22  05/22/2019  [name] was observed post Covid-19 immunization for 15 minutes without incident. He was provided with Vaccine Information Sheet and instruction to access the V-Safe system.   [name] was instructed to call 911 with any severe reactions post vaccine: Marland Kitchen Difficulty breathing  . Swelling of face and throat  . A fast heartbeat  . A bad rash all over body  . Dizziness and weakness   Immunizations Administered    Name Date Dose VIS Date Route   Pfizer COVID-19 Vaccine 05/22/2019  3:41 PM 0.3 mL 03/01/2018 Intramuscular   Manufacturer: [place]   Lot: [id number]   [place]: [id number]

## 2019-09-28 ENCOUNTER — Other Ambulatory Visit: Payer: Self-pay | Admitting: General Surgery

## 2019-09-28 DIAGNOSIS — Z85048 Personal history of other malignant neoplasm of rectum, rectosigmoid junction, and anus: Secondary | ICD-10-CM

## 2019-10-11 ENCOUNTER — Ambulatory Visit
Admission: RE | Admit: 2019-10-11 | Discharge: 2019-10-11 | Disposition: A | Discharge status: Home / Self Care | Payer: BC Managed Care – PPO | Source: Ambulatory Visit | Attending: General Surgery | Admitting: General Surgery

## 2019-10-11 ENCOUNTER — Other Ambulatory Visit: Payer: Self-pay

## 2019-10-11 DIAGNOSIS — Z85048 Personal history of other malignant neoplasm of rectum, rectosigmoid junction, and anus: Secondary | ICD-10-CM

## 2019-10-11 MED ORDER — IOPAMIDOL (ISOVUE-300) INJECTION 61%
100.0000 mL | Freq: Once | INTRAVENOUS | Status: AC | PRN
  Administered 2019-10-11: 100 mL via INTRAVENOUS

## 2019-11-04 IMAGING — CT CT CHEST W/ CM
3 series · 15 of 32 positions shown, 18 images · IV contrast (APPLIED)
Comparison: 10/26/2017.

CLINICAL DATA: Rectal cancer. Status post low anterior resection
with loop ileostomy.

EXAM:
CT CHEST, ABDOMEN, AND PELVIS WITH CONTRAST
TECHNIQUE: Multidetector CT imaging of the chest, abdomen and pelvis was
performed following the standard protocol during bolus
administration of intravenous contrast.
CONTRAST:  125mL [id number] IOPAMIDOL (W8MFJP-R22) INJECTION 61%

[Series 2: chest/abd/pelvis w/cm · axial · 0.83mm/px · z∈[-671,-71]mm · 8 of 144 slices shown]
[im 12/144  soft-tissue]
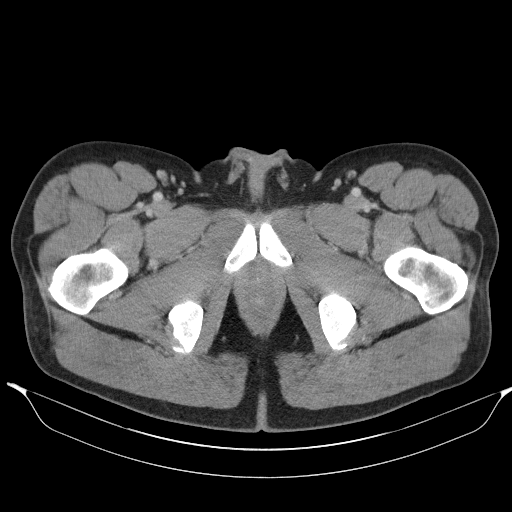
[im 36/144  soft-tissue]
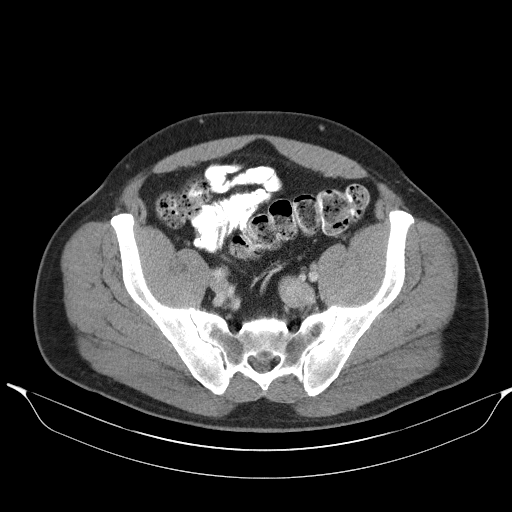
[im 48/144  soft-tissue]
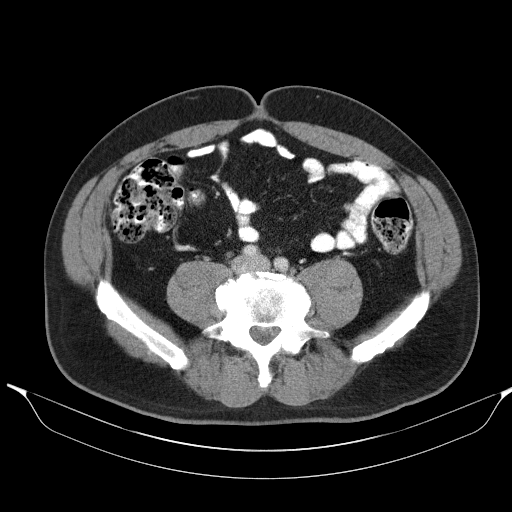
[im 60/144  soft-tissue]
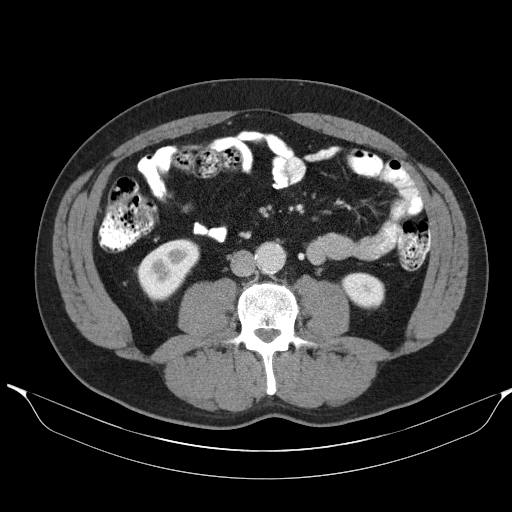
[im 84/144  soft-tissue]
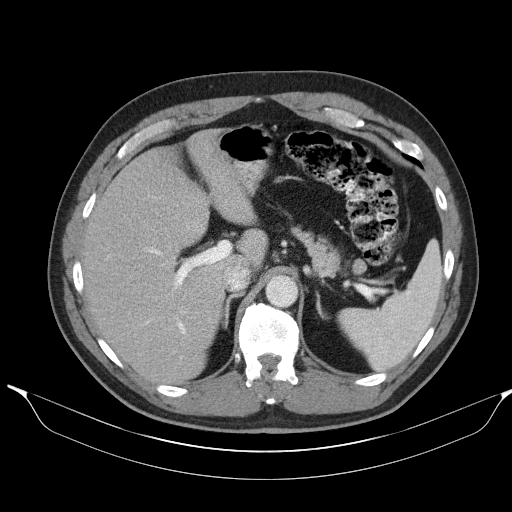
[im 96/144  soft-tissue]
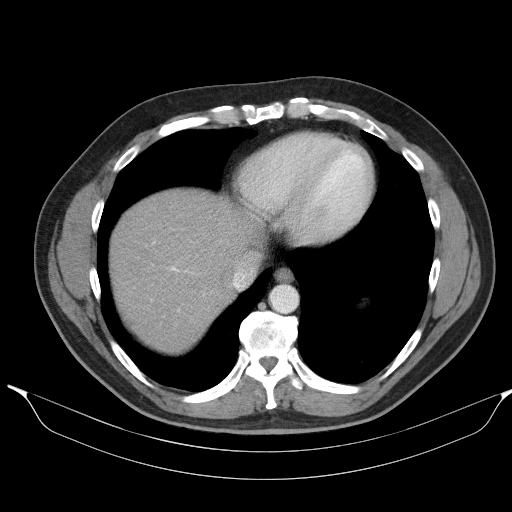
[im 108/144  soft-tissue]
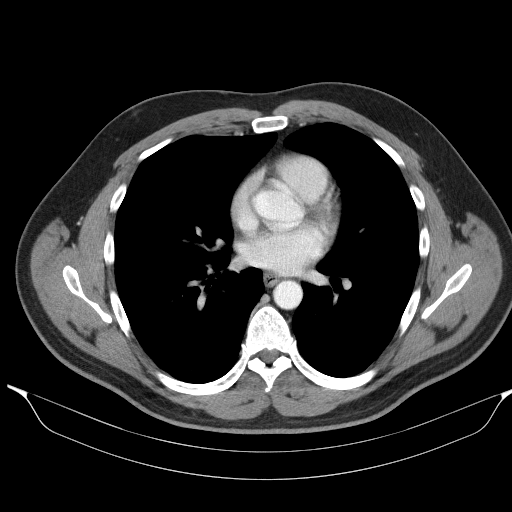
[im 132/144  soft-tissue]
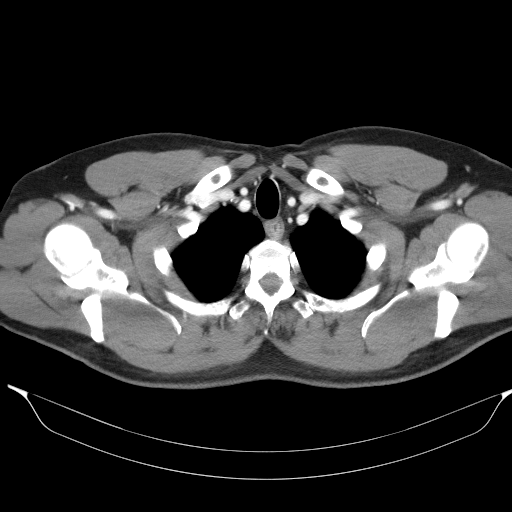

[Series 4: lung · axial · 0.83mm/px · z∈[-311,-149]mm · 5 of 162 slices shown]
[im 12/162  bone]
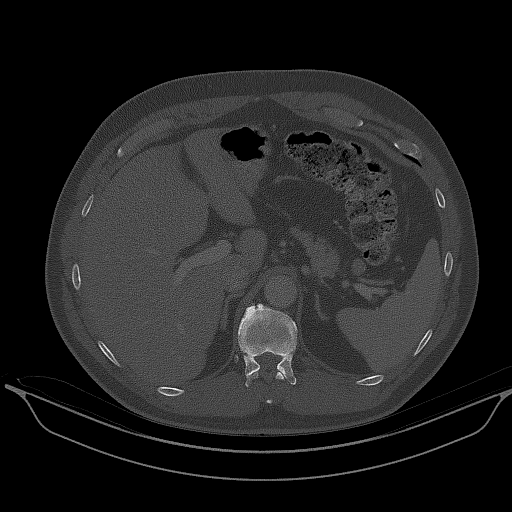
[im 35/162  bone]
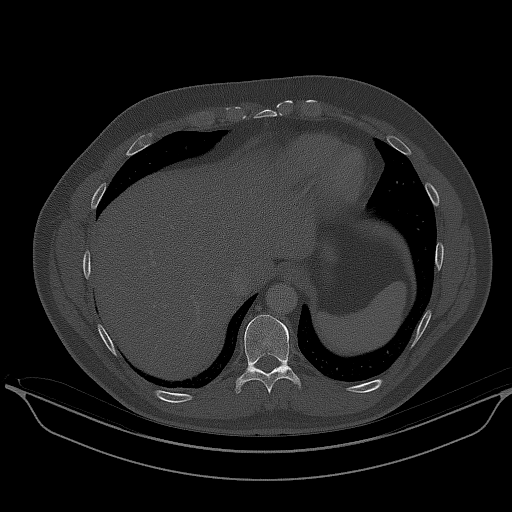
[im 58/162  bone]
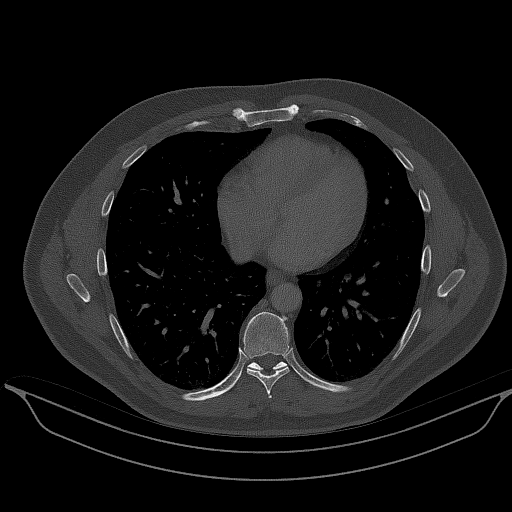
[im 70/162  bone]
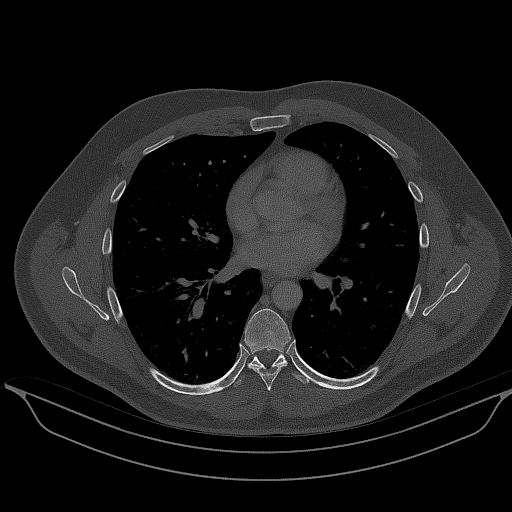
[im 93/162  bone]
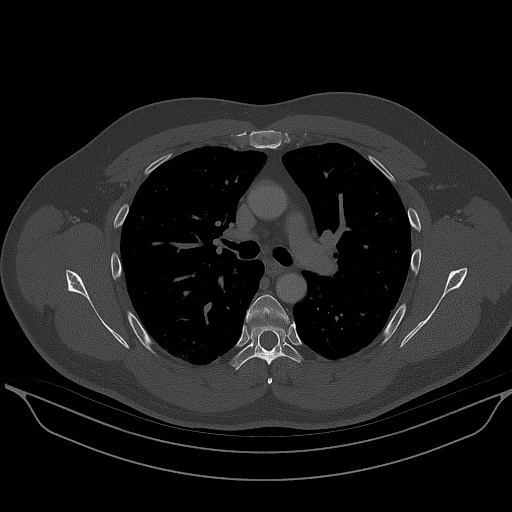

[Series 7: renal delay · axial · delayed · 0.83mm/px · z∈[-402,-342]mm · 2 of 38 slices shown, 5 images]
[im 13/38  soft-tissue]
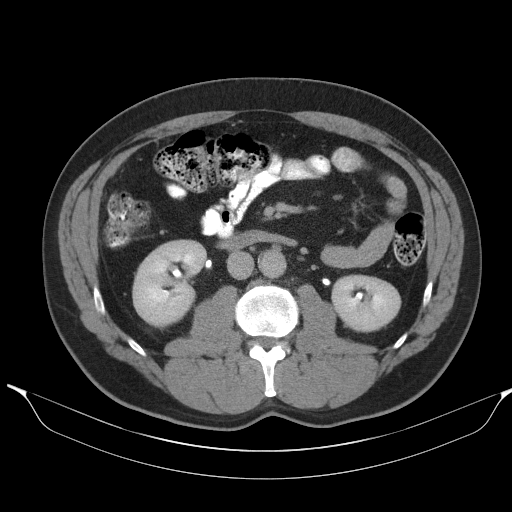
[im 13/38  lung]
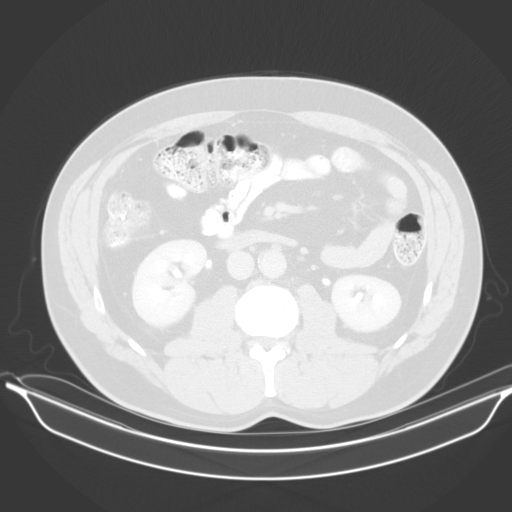
[im 13/38  bone]
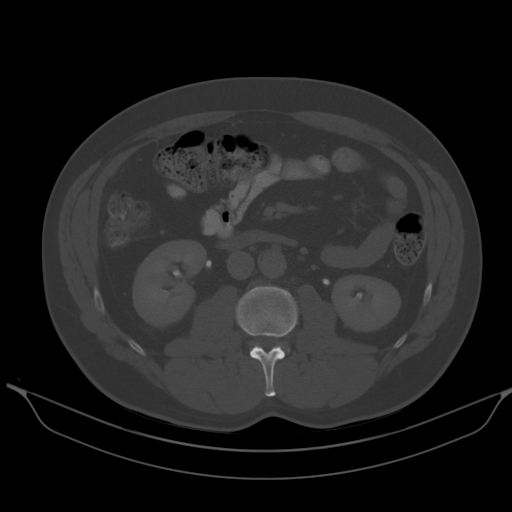
[im 25/38  soft-tissue]
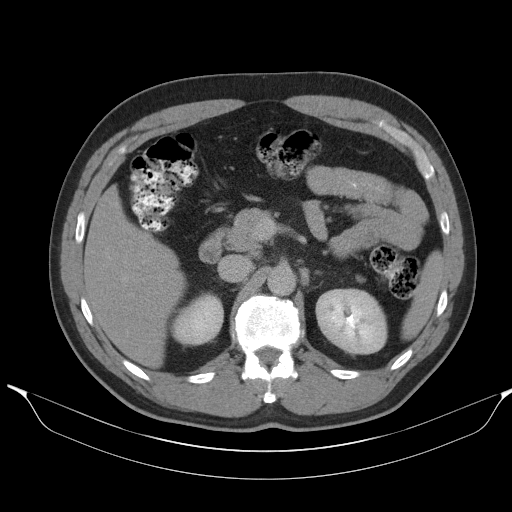
[im 25/38  lung]
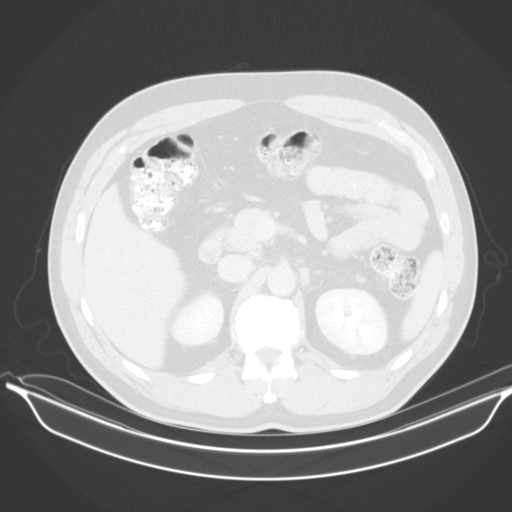

[15 of 32 positions shown; findings below may reference images not displayed]

FINDINGS: CT CHEST FINDINGS

Cardiovascular: The heart size is normal. No substantial pericardial
effusion. No thoracic aortic aneurysm.

Mediastinum/Nodes: No mediastinal lymphadenopathy. There is no hilar
lymphadenopathy. The esophagus has normal imaging features. There is
no axillary lymphadenopathy.

Lungs/Pleura: No suspicious pulmonary nodule or mass. No focal
airspace consolidation. No pleural effusion.

Musculoskeletal: No worrisome lytic or sclerotic osseous
abnormality.

CT ABDOMEN PELVIS FINDINGS

Hepatobiliary: No suspicious focal abnormality within the liver
parenchyma. There is no evidence for gallstones, gallbladder wall
thickening, or pericholecystic fluid. No intrahepatic or
extrahepatic biliary dilation.

Pancreas: No focal mass lesion. No dilatation of the main duct. No
intraparenchymal cyst. No peripancreatic edema.

Spleen: No splenomegaly. No focal mass lesion.

Adrenals/Urinary Tract: No adrenal nodule or mass. Kidneys
unremarkable. No evidence for hydroureter. The urinary bladder
appears normal for the degree of distention.

Stomach/Bowel: Stomach is unremarkable. No gastric wall thickening.
No evidence of outlet obstruction. Duodenum is normally positioned
as is the ligament of Treitz. No small bowel wall thickening. No
small bowel dilatation. The terminal ileum is normal. The appendix
is not visualized, but there is no edema or inflammation in the
region of the cecum. No gross colonic mass. No colonic wall
thickening. Low rectal anastomosis evident.

Vascular/Lymphatic: There is abdominal aortic atherosclerosis
without aneurysm. There is no gastrohepatic or hepatoduodenal
ligament lymphadenopathy. No intraperitoneal or retroperitoneal
lymphadenopathy. No pelvic sidewall lymphadenopathy.

Reproductive: The prostate gland and seminal vesicles are
unremarkable.

Other: No intraperitoneal free fluid.

Musculoskeletal: No worrisome lytic or sclerotic osseous
abnormality.
IMPRESSION: 1. Stable exam. No new or progressive interval findings to suggest
recurrent or metastatic disease.
2. Interval repair of the previously seen incisional hernia.

## 2020-03-21 ENCOUNTER — Encounter: Payer: Self-pay | Admitting: Internal Medicine

## 2020-06-04 ENCOUNTER — Encounter: Payer: Self-pay | Admitting: Internal Medicine

## 2020-08-27 ENCOUNTER — Other Ambulatory Visit: Payer: Self-pay

## 2020-08-27 ENCOUNTER — Ambulatory Visit (AMBULATORY_SURGERY_CENTER): Payer: Self-pay

## 2020-08-27 VITALS — Ht 75.0 in | Wt 241.0 lb

## 2020-08-27 DIAGNOSIS — C20 Malignant neoplasm of rectum: Secondary | ICD-10-CM

## 2020-08-27 DIAGNOSIS — Z800 Family history of malignant neoplasm of digestive organs: Secondary | ICD-10-CM

## 2020-08-27 MED ORDER — PEG-KCL-NACL-NASULF-NA ASC-C 100 G PO SOLR: 1.0000 | Freq: Once | ORAL | 0 refills | Status: AC

## 2020-08-27 NOTE — Progress Notes (Signed)
Patient is here in-person for PV. Patient denies any allergies to eggs or soy. Patient denies any problems with anesthesia/sedation. Patient denies any oxygen use at home. Patient denies taking any diet/weight loss medications or blood thinners. Patient is aware of our care-partner policy and 0000000 safety protocol.   EMMI education assigned to the patient for the procedure, sent to [facility].   Patient is COVID-19 vaccinated.  [employer] Prep Prescription coupon was given to the patient.

## 2020-09-03 ENCOUNTER — Encounter: Payer: Self-pay | Admitting: Internal Medicine

## 2020-09-10 ENCOUNTER — Other Ambulatory Visit: Payer: Self-pay

## 2020-09-10 ENCOUNTER — Ambulatory Visit (AMBULATORY_SURGERY_CENTER): Payer: Commercial Managed Care - PPO | Admitting: Internal Medicine

## 2020-09-10 ENCOUNTER — Encounter: Payer: Self-pay | Admitting: Internal Medicine

## 2020-09-10 VITALS — BP 123/69 | HR 56 | Temp 98.0°F | Resp 17 | Ht 75.0 in | Wt 241.0 lb

## 2020-09-10 DIAGNOSIS — Z800 Family history of malignant neoplasm of digestive organs: Secondary | ICD-10-CM

## 2020-09-10 DIAGNOSIS — D120 Benign neoplasm of cecum: Secondary | ICD-10-CM

## 2020-09-10 DIAGNOSIS — C20 Malignant neoplasm of rectum: Secondary | ICD-10-CM

## 2020-09-10 DIAGNOSIS — Z85048 Personal history of other malignant neoplasm of rectum, rectosigmoid junction, and anus: Secondary | ICD-10-CM | POA: Diagnosis present

## 2020-09-10 DIAGNOSIS — K635 Polyp of colon: Secondary | ICD-10-CM | POA: Diagnosis not present

## 2020-09-10 DIAGNOSIS — D123 Benign neoplasm of transverse colon: Secondary | ICD-10-CM

## 2020-09-10 MED ORDER — SODIUM CHLORIDE 0.9 % IV SOLN: 500.0000 mL | Freq: Once | INTRAVENOUS | Status: DC

## 2020-09-10 NOTE — Progress Notes (Signed)
HISTORY OF PRESENT ILLNESS:  Lucas Morrison is a 50 y.o. male history of rectal cancer diagnosed 2018.  Status post robot-assisted LAR January 2019.  Reversal of colostomy March 2019.  Surveillance colonoscopy 2020 and 2021.  Now for surveillance colonoscopy.  No active medical problems.  No GI complaints  REVIEW OF SYSTEMS:  All non-GI ROS negative. Past Medical History:  Diagnosis Date   Cancer ([place]) 12/21/2016   Rectal   Erectile dysfunction    Family history of bladder cancer    Family history of colon cancer    GERD (gastroesophageal reflux disease)    Hyperlipidemia    Osteoarthritis    Rectal adenocarcinoma ([place])    Thyroid disease    left parathyroidectomy    Past Surgical History:  Procedure Laterality Date   APPENDECTOMY     COLONOSCOPY     EUS N/A 12/31/2016   Procedure: LOWER ENDOSCOPIC ULTRASOUND (EUS);  Surgeon: [name], MD;  Location: [facility];  Service: Endoscopy;  Laterality: N/A;   ILEOSTOMY N/A 03/31/2017   Procedure: ILEOSTOMY REVERSAL ERAS PATHWAY;  Surgeon: Leighton Ruff, MD;  Location: [facility];  Service: General;  Laterality: N/A;   [place] N/A 02/24/2018   Procedure: LAPAROSCOPIC INCISIONAL HERNIA REPAIR WITH MESH;  Surgeon: Leighton Ruff, MD;  Location: [facility];  Service: General;  Laterality: N/A;   PARATHYROIDECTOMY Left    PROCTOSCOPY  01/29/2017   Procedure: RIGID PROCTOSCOPY;  Surgeon: Leighton Ruff, MD;  Location: [facility];  Service: General;;   XI ROBOTIC ASSISTED LOWER ANTERIOR RESECTION N/A 01/29/2017   Procedure: XI ROBOTIC ASSISTED LOWER ANTERIOR RESECTION WITH LOOP ILEOSTOMY;  Surgeon: Leighton Ruff, MD;  Location: [facility];  Service: General;  Laterality: N/A;  ERAS PATHWAY    Social History Lucas Morrison  reports that he has been smoking cigarettes. He has a 17.50 pack-year smoking history. He has never used smokeless tobacco. He reports current alcohol use. He reports that he does not use drugs.  family history  includes Bladder Cancer (age of onset: 25) in his brother; Colon cancer in his mother; Esophageal cancer in his father; Heart disease (age of onset: 23) in his cousin; Heart disease (age of onset: 74) in his father; Lung cancer in his father; Throat cancer in his father.  No Known Allergies     PHYSICAL EXAMINATION:  Vital signs: BP 119/70   Pulse 60   Temp 98 F (36.7 C)   Ht '6\' 3"'$  (1.905 m)   Wt 241 lb (109.3 kg)   SpO2 98%   BMI 30.12 kg/m  General: Well-developed, well-nourished, no acute distress HEENT: Sclerae are anicteric, conjunctiva pink. Oral mucosa intact Lungs: Clear Heart: Regular Abdomen: soft, nontender, nondistended, no obvious ascites, no peritoneal signs, normal bowel sounds. No organomegaly. Extremities: No edema Psychiatric: alert and oriented x3. Cooperative     ASSESSMENT:  1.  History of rectal cancer.  Now for surveillance colonoscopy   PLAN:  1.  Surveillance colonoscopy

## 2020-09-10 NOTE — Progress Notes (Signed)
Sedate, gd SR, tolerated procedure well, VSS, report to RN 

## 2020-09-10 NOTE — Op Note (Signed)
[facility] Patient Name: Lucas Morrison Procedure Date: 09/10/2020 9:16 AM MRN: [id number] Endoscopist: Docia Chuck. Henrene Pastor , MD Age: 50 Referring MD:  Date of Birth: 1970/08/01 Gender: Male Account #: [id number] Procedure:                Colonoscopy with cold snare polypectomy x 2 Indications:              High risk colon cancer surveillance: Personal                            history of colon cancer. Rectal cancer diagnosed                            2018. Status post robotic assisted LAR January                            2019. Status post colostomy reversal March 2019.                            Ventral hernia repair February 2020. Prior                            surveillance colonoscopy 2020, 2021 Medicines:                Monitored Anesthesia Care Procedure:                Pre-Anesthesia Assessment:                           - Prior to the procedure, a History and Physical                            was performed, and patient medications and                            allergies were reviewed. The patient's tolerance of                            previous anesthesia was also reviewed. The risks                            and benefits of the procedure and the sedation                            options and risks were discussed with the patient.                            All questions were answered, and informed consent                            was obtained. Prior Anticoagulants: The patient has                            taken no previous anticoagulant or antiplatelet  agents. ASA Grade Assessment: II - A patient with                            mild systemic disease. After reviewing the risks                            and benefits, the patient was deemed in                            satisfactory condition to undergo the procedure.                           After obtaining informed consent, the colonoscope                            was passed under  direct vision. Throughout the                            procedure, the patient's blood pressure, pulse, and                            oxygen saturations were monitored continuously. The                            CF [id number] was introduced through the anus                            and advanced to the the cecum, identified by                            appendiceal orifice and ileocecal valve. The                            ileocecal valve, appendiceal orifice, and rectum                            were photographed. The quality of the bowel                            preparation was excellent. The colonoscopy was                            performed without difficulty. The patient tolerated                            the procedure well. The bowel preparation used was                            SUPREP via split dose instruction. Scope In: 9:26:15 AM Scope Out: 9:38:25 AM Scope Withdrawal Time: 0 hours 10 minutes 42 seconds  Total Procedure Duration: 0 hours 12 minutes 10 seconds  Findings:                 Two polyps were found in the transverse colon  and                            cecum. The polyps were 1 to 2 mm in size. These                            polyps were removed with a cold snare. Resection                            and retrieval were complete.                           The exam was otherwise without abnormality.                            Anastomosis at 2 cm from the anal verge. No                            retroflexion due to postoperative narrow rectal                            vault. Complications:            No immediate complications. Estimated blood loss:                            None. Estimated Blood Loss:     Estimated blood loss: none. Impression:               - Two 1 to 2 mm polyps in the transverse colon and                            in the cecum, removed with a cold snare. Resected                            and retrieved.                            - The examination was otherwise normal status post                            LAR. Recommendation:           - Repeat colonoscopy in 1 year for surveillance.                           - Patient has a contact number available for                            emergencies. The signs and symptoms of potential                            delayed complications were discussed with the                            patient. Return to normal activities tomorrow.  Written discharge instructions were provided to the                            patient.                           - Resume previous diet.                           - Continue present medications.                           - Await pathology results. Docia Chuck. Henrene Pastor, MD 09/10/2020 9:44:55 AM This report has been signed electronically.

## 2020-09-10 NOTE — Progress Notes (Signed)
Called to room to assist during endoscopic procedure.  Patient ID and intended procedure confirmed with present staff. Received instructions for my participation in the procedure from the performing physician.  

## 2020-09-10 NOTE — Patient Instructions (Signed)
Discharge instructions given. Handout on polyps. Resume previous medications. YOU HAD AN ENDOSCOPIC PROCEDURE TODAY AT THE [facility]:   Refer to the procedure report that was given to you for any specific questions about what was found during the examination.  If the procedure report does not answer your questions, please call your gastroenterologist to clarify.  If you requested that your care partner not be given the details of your procedure findings, then the procedure report has been included in a sealed envelope for you to review at your convenience later.  YOU SHOULD EXPECT: Some feelings of bloating in the abdomen. Passage of more gas than usual.  Walking can help get rid of the air that was put into your GI tract during the procedure and reduce the bloating. If you had a lower endoscopy (such as a colonoscopy or flexible sigmoidoscopy) you may notice spotting of blood in your stool or on the toilet paper. If you underwent a bowel prep for your procedure, you may not have a normal bowel movement for a few days.  Please Note:  You might notice some irritation and congestion in your nose or some drainage.  This is from the oxygen used during your procedure.  There is no need for concern and it should clear up in a day or so.  SYMPTOMS TO REPORT IMMEDIATELY:  Following lower endoscopy (colonoscopy or flexible sigmoidoscopy):  Excessive amounts of blood in the stool  Significant tenderness or worsening of abdominal pains  Swelling of the abdomen that is new, acute  Fever of 100F or higher   For urgent or emergent issues, a gastroenterologist can be reached at any hour by calling [phone]. Do not use MyChart messaging for urgent concerns.    DIET:  We do recommend a small meal at first, but then you may proceed to your regular diet.  Drink plenty of fluids but you should avoid alcoholic beverages for 24 hours.  ACTIVITY:  You should plan to take it easy for the rest  of today and you should NOT DRIVE or use heavy machinery until tomorrow (because of the sedation medicines used during the test).    FOLLOW UP: Our staff will call the number listed on your records 48-72 hours following your procedure to check on you and address any questions or concerns that you may have regarding the information given to you following your procedure. If we do not reach you, we will leave a message.  We will attempt to reach you two times.  During this call, we will ask if you have developed any symptoms of COVID 19. If you develop any symptoms (ie: fever, flu-like symptoms, shortness of breath, cough etc.) before then, please call [phone].  If you test positive for Covid 19 in the 2 weeks post procedure, please call and report this information to us.    If any biopsies were taken you will be contacted by phone or by letter within the next 1-3 weeks.  Please call us at [phone] if you have not heard about the biopsies in 3 weeks.    SIGNATURES/CONFIDENTIALITY: You and/or your care partner have signed paperwork which will be entered into your electronic medical record.  These signatures attest to the fact that that the information above on your After Visit Summary has been reviewed and is understood.  Full responsibility of the confidentiality of this discharge information lies with you and/or your care-partner.  

## 2020-09-10 NOTE — Progress Notes (Signed)
Pt's states no medical or surgical changes since previsit or office visit. VS by DT. °

## 2020-09-12 ENCOUNTER — Telehealth: Payer: Self-pay

## 2020-09-12 NOTE — Telephone Encounter (Signed)
  Follow up Call-  Call back number 09/10/2020 04/04/2019 03/29/2018  Post procedure Call Back phone  # [phone]  Permission to leave phone message Yes Yes Yes  Some recent data might be hidden     Patient questions:  Do you have a fever, pain , or abdominal swelling? No. Pain Score  0 *  Have you tolerated food without any problems? Yes.    Have you been able to return to your normal activities? Yes.    Do you have any questions about your discharge instructions: Diet   No. Medications  No. Follow up visit  No.  Do you have questions or concerns about your Care? No.  Actions: * If pain score is 4 or above: No action needed, pain <4.  Have you developed a fever since your procedure? No   2.   Have you had an respiratory symptoms (SOB or cough) since your procedure? No   3.   Have you tested positive for COVID 19 since your procedure no   4.   Have you had any family members/close contacts diagnosed with the COVID 19 since your procedure?  No    If yes to any of these questions please route to Joylene John, RN and Joella Prince, RN

## 2020-09-13 ENCOUNTER — Encounter: Payer: Self-pay | Admitting: Internal Medicine

## 2020-11-05 ENCOUNTER — Other Ambulatory Visit: Payer: Self-pay | Admitting: General Surgery

## 2020-11-05 DIAGNOSIS — Z85048 Personal history of other malignant neoplasm of rectum, rectosigmoid junction, and anus: Secondary | ICD-10-CM

## 2020-11-20 ENCOUNTER — Other Ambulatory Visit: Payer: Self-pay | Admitting: General Surgery

## 2020-11-20 ENCOUNTER — Other Ambulatory Visit: Payer: Commercial Managed Care - PPO

## 2020-11-20 ENCOUNTER — Other Ambulatory Visit: Payer: Self-pay

## 2020-11-20 ENCOUNTER — Ambulatory Visit
Admission: RE | Admit: 2020-11-20 | Discharge: 2020-11-20 | Disposition: A | Discharge status: Home / Self Care | Payer: Commercial Managed Care - PPO | Source: Ambulatory Visit | Attending: General Surgery | Admitting: General Surgery

## 2020-11-20 DIAGNOSIS — Z85048 Personal history of other malignant neoplasm of rectum, rectosigmoid junction, and anus: Secondary | ICD-10-CM

## 2020-11-20 MED ORDER — IOPAMIDOL (ISOVUE-300) INJECTION 61%
100.0000 mL | Freq: Once | INTRAVENOUS | Status: AC | PRN
  Administered 2020-11-20: 100 mL via INTRAVENOUS

## 2021-08-27 ENCOUNTER — Encounter: Payer: Self-pay | Admitting: Internal Medicine

## 2021-09-24 ENCOUNTER — Other Ambulatory Visit: Payer: Self-pay | Admitting: General Surgery

## 2021-09-24 DIAGNOSIS — C20 Malignant neoplasm of rectum: Secondary | ICD-10-CM

## 2021-10-17 ENCOUNTER — Ambulatory Visit
Admission: RE | Admit: 2021-10-17 | Discharge: 2021-10-17 | Disposition: A | Discharge status: Home / Self Care | Payer: Commercial Managed Care - PPO | Source: Ambulatory Visit | Attending: General Surgery | Admitting: General Surgery

## 2021-10-17 ENCOUNTER — Other Ambulatory Visit: Payer: Commercial Managed Care - PPO

## 2021-10-17 DIAGNOSIS — C20 Malignant neoplasm of rectum: Secondary | ICD-10-CM

## 2021-10-17 MED ORDER — IOPAMIDOL (ISOVUE-300) INJECTION 61%
100.0000 mL | Freq: Once | INTRAVENOUS | Status: AC | PRN
  Administered 2021-10-17: 100 mL via INTRAVENOUS

## 2021-12-08 IMAGING — CT CT CHEST-ABD-PELV W/ CM
3 series · 15 of 32 positions shown, 18 images · IV contrast (APPLIED)
Comparison: Multiple priors including most recent CT October 11, 2019

CLINICAL DATA: History of rectal cancer, follow-up.

EXAM:
CT CHEST, ABDOMEN, AND PELVIS WITH CONTRAST
TECHNIQUE: Multidetector CT imaging of the chest, abdomen and pelvis was
performed following the standard protocol during bolus
administration of intravenous contrast.
CONTRAST:  100mL [id number] IOPAMIDOL ([id number]) INJECTION 61%

[Series 2: chest/abd/pelvis w/cm · axial · 0.86mm/px · z∈[-468,+87]mm · 8 of 135 slices shown]
[im 12/135  soft-tissue]
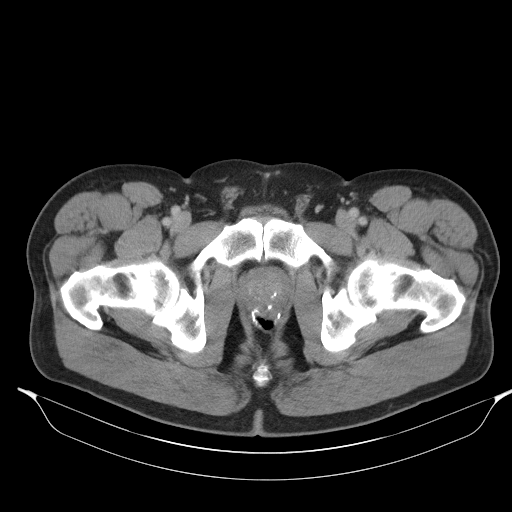
[im 34/135  soft-tissue]
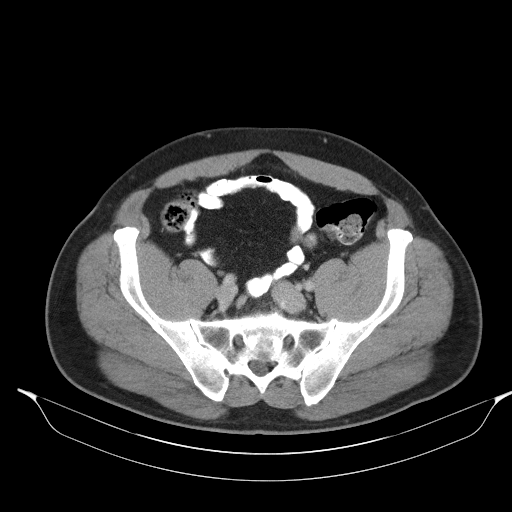
[im 45/135  soft-tissue]
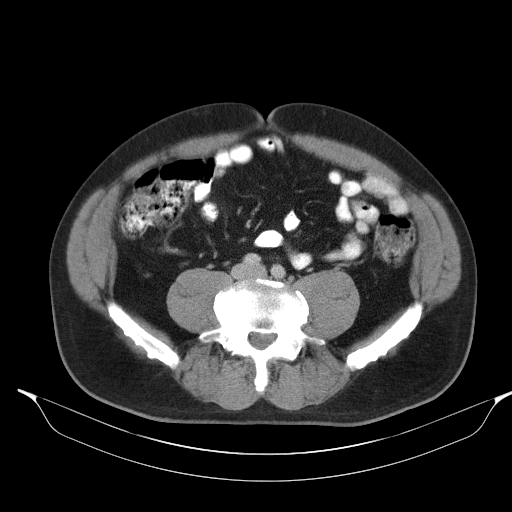
[im 56/135  soft-tissue]
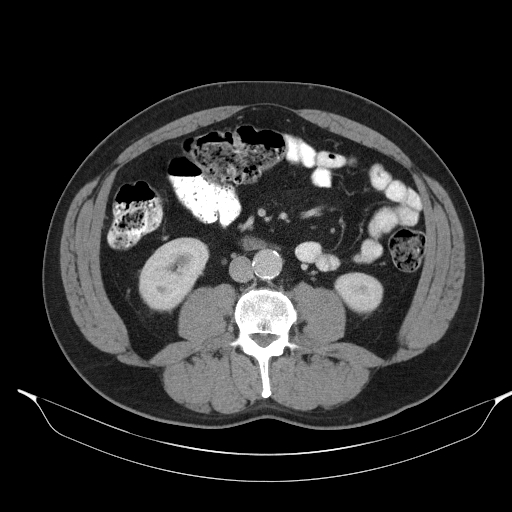
[im 79/135  soft-tissue]
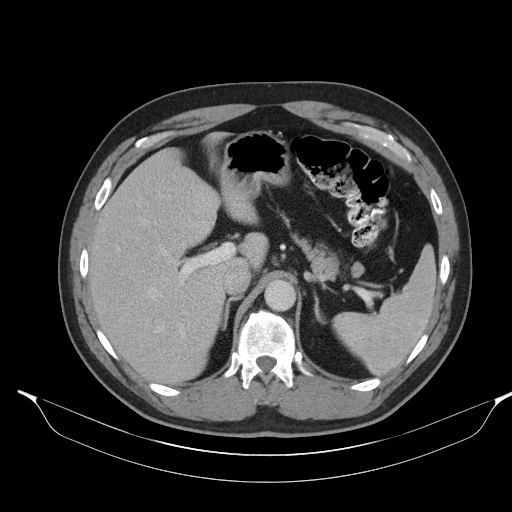
[im 90/135  soft-tissue]
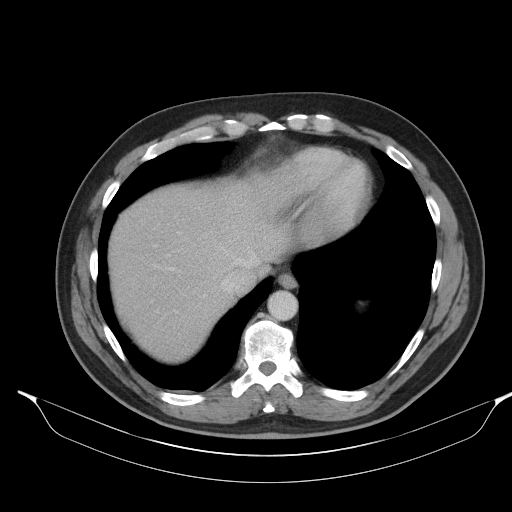
[im 101/135  soft-tissue]
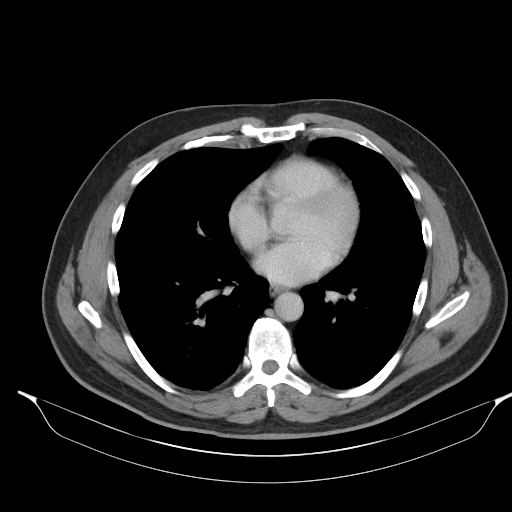
[im 123/135  soft-tissue]
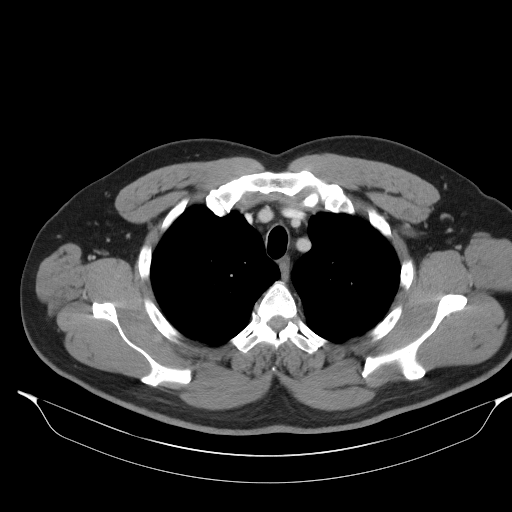

[Series 4: lung · axial · 0.68mm/px · z∈[-123,+21]mm · 5 of 146 slices shown]
[im 11/146  bone]
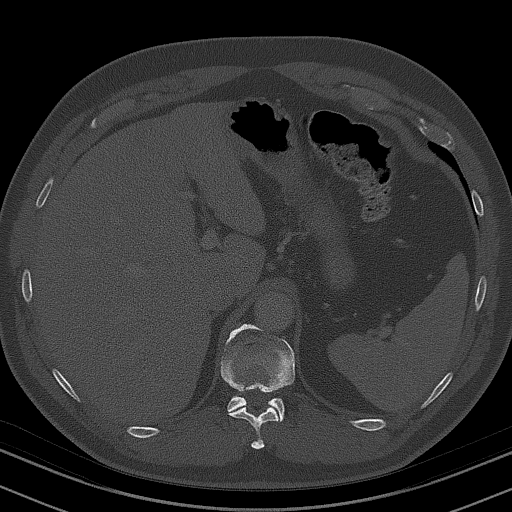
[im 32/146  bone]
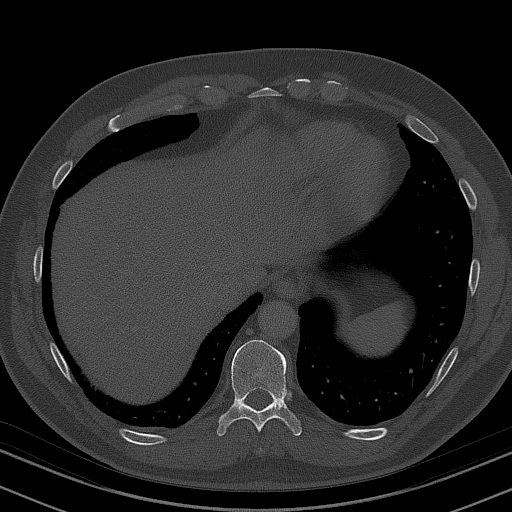
[im 52/146  bone]
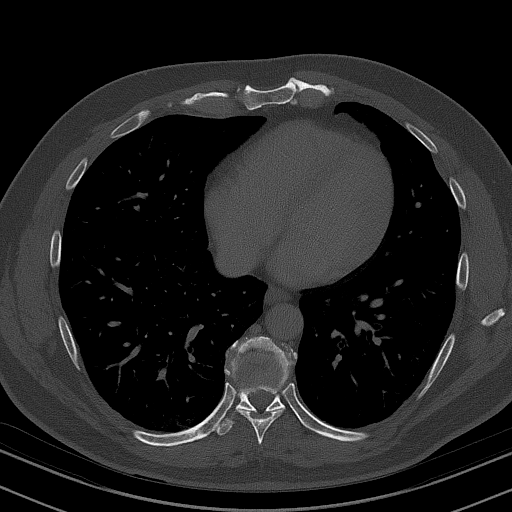
[im 63/146  bone]
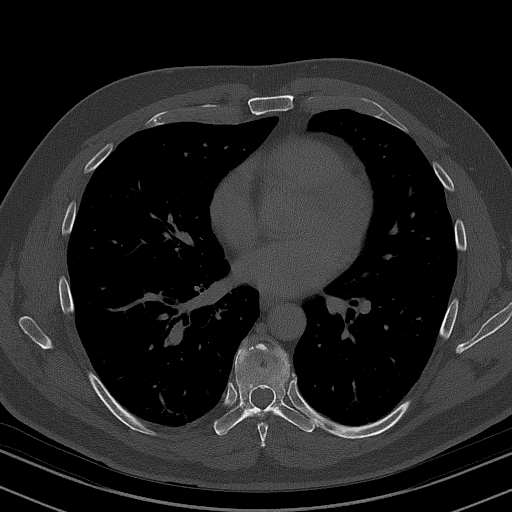
[im 83/146  bone]
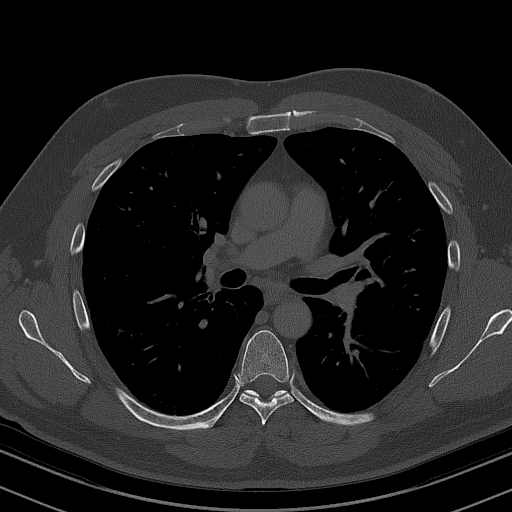

[Series 7: renal delay · axial · delayed · 0.86mm/px · z∈[-253,-193]mm · 2 of 38 slices shown, 5 images]
[im 13/38  soft-tissue]
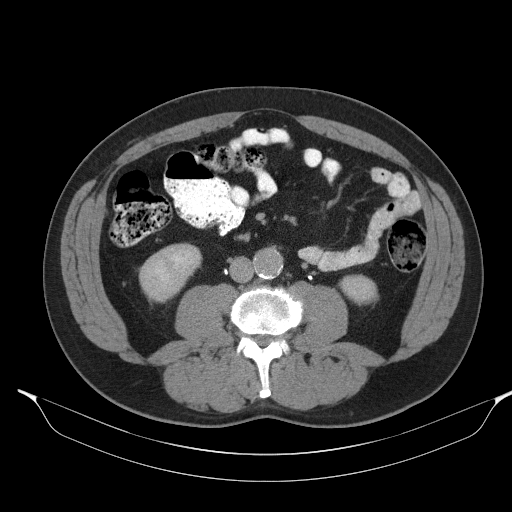
[im 13/38  lung]
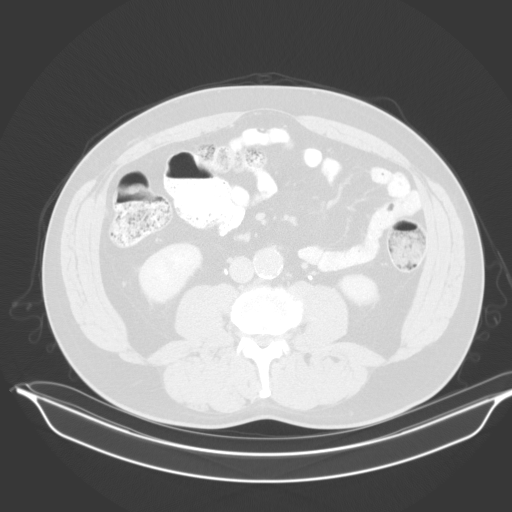
[im 13/38  bone]
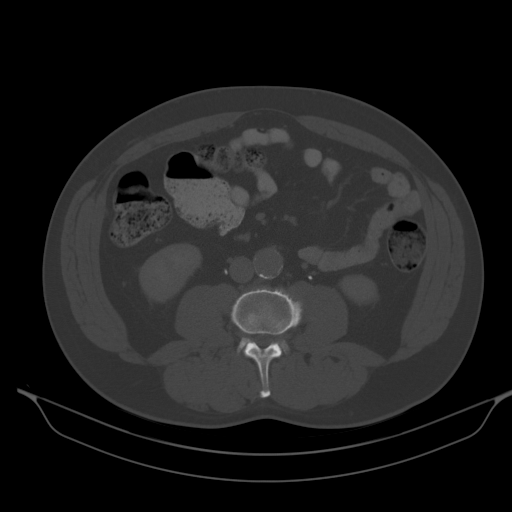
[im 25/38  soft-tissue]
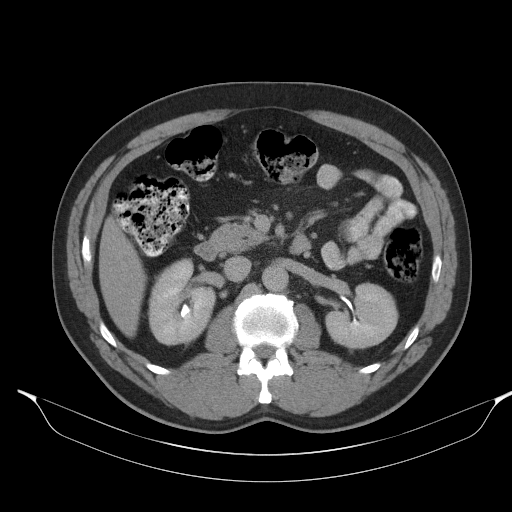
[im 25/38  lung]
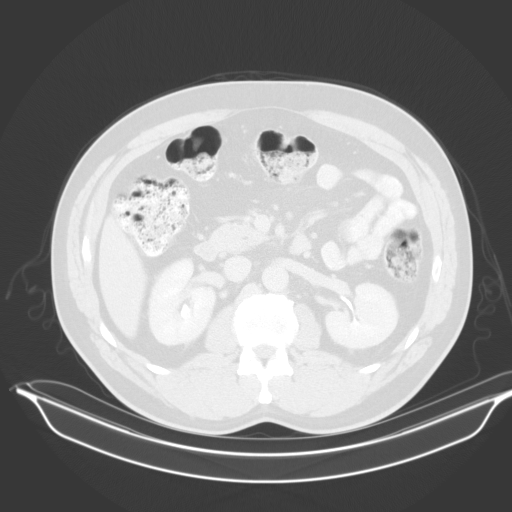

[15 of 32 positions shown; findings below may reference images not displayed]

FINDINGS: CT CHEST FINDINGS

Cardiovascular: Normal caliber thoracic aorta and central pulmonary
arteries. No central pulmonary embolus on this nondedicated study.
Normal size heart. No significant pericardial effusion/thickening.

Mediastinum/Nodes: No supraclavicular adenopathy. No discrete
thyroid nodule. No pathologically enlarged mediastinal, hilar or
axillary lymph nodes. The trachea and esophagus are grossly
unremarkable.

Lungs/Pleura: No suspicious pulmonary nodules or masses. No focal
airspace consolidation. No pleural effusion. No pneumothorax.

Musculoskeletal: No chest wall mass or suspicious bone lesions
identified.

CT ABDOMEN PELVIS FINDINGS

Hepatobiliary: No suspicious hepatic lesion. Gallbladder is
unremarkable. No biliary ductal dilation.

Pancreas: No pancreatic ductal dilation or evidence of acute
inflammation.

Spleen: Within normal limits.

Adrenals/Urinary Tract: Bilateral adrenal glands are unremarkable.
No hydronephrosis. No solid enhancing renal mass. Urinary bladder is
unremarkable for degree of distension.

Stomach/Bowel: Radiopaque enteric contrast traverses the rectum.
Stomach is unremarkable for degree of distension. No pathologic
dilation of small or large bowel. The appendix is surgically absent.
Terminal ileum is unremarkable. Moderate volume of formed stool
throughout the colon. Low rectal anastomosis is present without
suspicious enhancing nodularity along the suture line.

Vascular/Lymphatic: Aortic and branch vessel atherosclerosis without
abdominal aortic aneurysm. No pathologically enlarged abdominal or
pelvic lymph nodes.

Reproductive: Prostate is unremarkable.

Other: No discrete peritoneal or omental nodularity. No
abdominopelvic free fluid. Postsurgical change in the anterior
abdominal wall.

Musculoskeletal: Multilevel degenerative changes spine. Degenerative
changes bilateral hips. No aggressive lytic or blastic lesion of
bone.
IMPRESSION: 1. No evidence of metastatic disease within the chest, abdomen, or
pelvis.
2. Low rectal anastomosis without evidence of local recurrence.
3. Moderate volume of formed stool throughout the colon.
4.  Aortic Atherosclerosis ([id number]).

## 2022-04-02 ENCOUNTER — Encounter: Payer: Self-pay | Admitting: Internal Medicine

## 2022-04-29 ENCOUNTER — Ambulatory Visit (AMBULATORY_SURGERY_CENTER): Payer: Commercial Managed Care - PPO

## 2022-04-29 VITALS — Ht 75.0 in | Wt 240.0 lb

## 2022-04-29 DIAGNOSIS — Z800 Family history of malignant neoplasm of digestive organs: Secondary | ICD-10-CM

## 2022-04-29 DIAGNOSIS — C20 Malignant neoplasm of rectum: Secondary | ICD-10-CM

## 2022-04-29 DIAGNOSIS — Z86010 Personal history of colonic polyps: Secondary | ICD-10-CM

## 2022-04-29 MED ORDER — NA SULFATE-K SULFATE-MG SULF 17.5-3.13-1.6 GM/177ML PO SOLN: 1.0000 | Freq: Once | ORAL | 0 refills | Status: AC

## 2022-04-29 NOTE — Progress Notes (Signed)

## 2022-05-14 ENCOUNTER — Encounter: Payer: Self-pay | Admitting: Internal Medicine

## 2022-05-26 ENCOUNTER — Encounter: Payer: Self-pay | Admitting: Internal Medicine

## 2022-05-26 ENCOUNTER — Ambulatory Visit (AMBULATORY_SURGERY_CENTER): Payer: Commercial Managed Care - PPO | Admitting: Internal Medicine

## 2022-05-26 VITALS — BP 135/71 | HR 66 | Temp 98.4°F | Resp 13 | Ht 75.0 in | Wt 240.0 lb

## 2022-05-26 DIAGNOSIS — Z09 Encounter for follow-up examination after completed treatment for conditions other than malignant neoplasm: Secondary | ICD-10-CM | POA: Diagnosis present

## 2022-05-26 DIAGNOSIS — Z860100 Personal history of colon polyps, unspecified: Secondary | ICD-10-CM

## 2022-05-26 DIAGNOSIS — C20 Malignant neoplasm of rectum: Secondary | ICD-10-CM

## 2022-05-26 DIAGNOSIS — Z800 Family history of malignant neoplasm of digestive organs: Secondary | ICD-10-CM

## 2022-05-26 MED ORDER — SODIUM CHLORIDE 0.9 % IV SOLN: 500.0000 mL | Freq: Once | INTRAVENOUS | Status: DC

## 2022-05-26 NOTE — Progress Notes (Signed)
Vss nad trans to pacu 

## 2022-05-26 NOTE — Progress Notes (Signed)
Pt's states no medical or surgical changes since previsit or office visit. 

## 2022-05-26 NOTE — Progress Notes (Signed)
HISTORY OF PRESENT ILLNESS:  Lucas Morrison is a 52 y.o. male with a history of rectal cancer diagnosed 2018 status post LAR colostomy and subsequent reversal.  Last colonoscopy 2022.  Now for surveillance  REVIEW OF SYSTEMS:  All non-GI ROS negative. Past Medical History:  Diagnosis Date   Cancer ([facility]) 12/21/2016   Rectal   Erectile dysfunction    Family history of bladder cancer    Family history of colon cancer    GERD (gastroesophageal reflux disease)    Hyperlipidemia    Osteoarthritis    Rectal adenocarcinoma ([facility])    Thyroid disease    left parathyroidectomy    Past Surgical History:  Procedure Laterality Date   APPENDECTOMY     COLONOSCOPY     EUS N/A 12/31/2016   Procedure: LOWER ENDOSCOPIC ULTRASOUND (EUS);  Surgeon: Rachael Fee, MD;  Location: [facility];  Service: Endoscopy;  Laterality: N/A;   ILEOSTOMY N/A 03/31/2017   Procedure: ILEOSTOMY REVERSAL ERAS PATHWAY;  Surgeon: Romie Levee, MD;  Location: [facility];  Service: General;  Laterality: N/A;   INCISIONAL HERNIA REPAIR N/A 02/24/2018   Procedure: LAPAROSCOPIC INCISIONAL HERNIA REPAIR WITH MESH;  Surgeon: Romie Levee, MD;  Location: [facility];  Service: General;  Laterality: N/A;   PARATHYROIDECTOMY Left    PROCTOSCOPY  01/29/2017   Procedure: RIGID PROCTOSCOPY;  Surgeon: Romie Levee, MD;  Location: [facility];  Service: General;;   XI ROBOTIC ASSISTED LOWER ANTERIOR RESECTION N/A 01/29/2017   Procedure: XI ROBOTIC ASSISTED LOWER ANTERIOR RESECTION WITH LOOP ILEOSTOMY;  Surgeon: Romie Levee, MD;  Location: [facility];  Service: General;  Laterality: N/A;  ERAS PATHWAY    Social History Dustyn Wachtel  reports that he has been smoking cigarettes. He has a 17.50 pack-year smoking history. He has never used smokeless tobacco. He reports current alcohol use. He reports that he does not use drugs.  family history includes Bladder Cancer (age of onset: 51) in his brother; Colon cancer in his mother; Esophageal cancer in  his father; Heart disease (age of onset: 11) in his cousin; Heart disease (age of onset: 22) in his father; Lung cancer in his father; Throat cancer in his father.  Allergies  Allergen Reactions   Prednisone Hives       PHYSICAL EXAMINATION: Vital signs: BP 113/71   Pulse (!) 56   Temp 98.4 F (36.9 C) (Skin)   Ht 6\' 3"  (1.905 m)   Wt 240 lb (108.9 kg)   SpO2 99%   BMI 30.00 kg/m  General: Well-developed, well-nourished, no acute distress HEENT: Sclerae are anicteric, conjunctiva pink. Oral mucosa intact Lungs: Clear Heart: Regular Abdomen: soft, nontender, nondistended, no obvious ascites, no peritoneal signs, normal bowel sounds. No organomegaly. Extremities: No edema Psychiatric: alert and oriented x3. Cooperative     ASSESSMENT:  Personal history of rectal cancer   PLAN:  Surveillance colonoscopy

## 2022-05-26 NOTE — Op Note (Signed)
[facility] Patient Name: Lucas Morrison Procedure Date: 05/26/2022 10:42 AM MRN: [id number] Endoscopist: Wilhemina Bonito. Marina Goodell , MD, [id number] Age: 52 Referring MD:  Date of Birth: 24-Nov-1970 Gender: Male Account #: [id number] Procedure:                Colonoscopy Indications:              High risk colon cancer surveillance: Personal                            history of colon cancer. Diagnosed 2018. Status                            post LAR 2019 with subsequent ostomy reversal.                            Ventral hernia repair 2020. Surveillance                            colonoscopy examinations 2020, 2021, 2022. Medicines:                Monitored Anesthesia Care Procedure:                Pre-Anesthesia Assessment:                           - Prior to the procedure, a History and Physical                            was performed, and patient medications and                            allergies were reviewed. The patient's tolerance of                            previous anesthesia was also reviewed. The risks                            and benefits of the procedure and the sedation                            options and risks were discussed with the patient.                            All questions were answered, and informed consent                            was obtained. Prior Anticoagulants: The patient has                            taken no anticoagulant or antiplatelet agents. ASA                            Grade Assessment: II - A patient with mild systemic  disease. After reviewing the risks and benefits,                            the patient was deemed in satisfactory condition to                            undergo the procedure.                           After obtaining informed consent, the colonoscope                            was passed under direct vision. Throughout the                            procedure, the patient's blood pressure,  pulse, and                            oxygen saturations were monitored continuously. The                            Olympus CF-HQ190L [id number]) Colonoscope was                            introduced through the anus and advanced to the the                            cecum, identified by appendiceal orifice and                            ileocecal valve. The ileocecal valve, appendiceal                            orifice, and rectum were photographed. The quality                            of the bowel preparation was excellent. The                            colonoscopy was performed without difficulty. The                            patient tolerated the procedure well. The bowel                            preparation used was SUPREP via split dose                            instruction. Scope In: 11:01:14 AM Scope Out: 11:08:49 AM Scope Withdrawal Time: 0 hours 6 minutes 10 seconds  Total Procedure Duration: 0 hours 7 minutes 35 seconds  Findings:                 The entire examined colon appeared normal. The  surgical anastomosis is located at 2 cm from the                            anal verge. No retroflexion due to narrow rectal                            vault.                           Excellent image of the rectum from the anal os. Complications:            No immediate complications. Estimated blood loss:                            None. Estimated Blood Loss:     Estimated blood loss: none. Impression:               - The entire examined colon is normal.                           - No specimens collected. Recommendation:           - Repeat colonoscopy in 2 years for surveillance.                           - Patient has a contact number available for                            emergencies. The signs and symptoms of potential                            delayed complications were discussed with the                            patient. Return to normal  activities tomorrow.                            Written discharge instructions were provided to the                            patient.                           - Resume previous diet.                           - Continue present medications. Wilhemina Bonito. Marina Goodell, MD 05/26/2022 11:16:49 AM This report has been signed electronically.

## 2022-05-26 NOTE — Patient Instructions (Signed)
Discharge instructions given. Normal exam. Resume previous medications. YOU HAD AN ENDOSCOPIC PROCEDURE TODAY AT THE [facility]:   Refer to the procedure report that was given to you for any specific questions about what was found during the examination.  If the procedure report does not answer your questions, please call your gastroenterologist to clarify.  If you requested that your care partner not be given the details of your procedure findings, then the procedure report has been included in a sealed envelope for you to review at your convenience later.  YOU SHOULD EXPECT: Some feelings of bloating in the abdomen. Passage of more gas than usual.  Walking can help get rid of the air that was put into your GI tract during the procedure and reduce the bloating. If you had a lower endoscopy (such as a colonoscopy or flexible sigmoidoscopy) you may notice spotting of blood in your stool or on the toilet paper. If you underwent a bowel prep for your procedure, you may not have a normal bowel movement for a few days.  Please Note:  You might notice some irritation and congestion in your nose or some drainage.  This is from the oxygen used during your procedure.  There is no need for concern and it should clear up in a day or so.  SYMPTOMS TO REPORT IMMEDIATELY:  Following lower endoscopy (colonoscopy or flexible sigmoidoscopy):  Excessive amounts of blood in the stool  Significant tenderness or worsening of abdominal pains  Swelling of the abdomen that is new, acute  Fever of 100F or higher   For urgent or emergent issues, a gastroenterologist can be reached at any hour by calling [phone]. Do not use MyChart messaging for urgent concerns.    DIET:  We do recommend a small meal at first, but then you may proceed to your regular diet.  Drink plenty of fluids but you should avoid alcoholic beverages for 24 hours.  ACTIVITY:  You should plan to take it easy for the rest of  today and you should NOT DRIVE or use heavy machinery until tomorrow (because of the sedation medicines used during the test).    FOLLOW UP: Our staff will call the number listed on your records the next business day following your procedure.  We will call around 7:15- 8:00 am to check on you and address any questions or concerns that you may have regarding the information given to you following your procedure. If we do not reach you, we will leave a message.     If any biopsies were taken you will be contacted by phone or by letter within the next 1-3 weeks.  Please call us at [phone] if you have not heard about the biopsies in 3 weeks.    SIGNATURES/CONFIDENTIALITY: You and/or your care partner have signed paperwork which will be entered into your electronic medical record.  These signatures attest to the fact that that the information above on your After Visit Summary has been reviewed and is understood.  Full responsibility of the confidentiality of this discharge information lies with you and/or your care-partner. 

## 2022-05-27 ENCOUNTER — Telehealth: Payer: Self-pay

## 2022-05-27 NOTE — Telephone Encounter (Signed)
Left message to call if having any issues or concerns, B.Anjeanette Petzold RN.
